# Patient Record
Sex: Male | Born: 1965 | Race: Black or African American | Hispanic: No | Marital: Married | Smoking: Current every day smoker
Health system: Southern US, Community
[De-identification: ages and names within clinical notes are randomized; demographics above are authoritative.]

## PROBLEM LIST (undated history)

## (undated) DIAGNOSIS — I10 Essential (primary) hypertension: Secondary | ICD-10-CM

## (undated) DIAGNOSIS — G4730 Sleep apnea, unspecified: Secondary | ICD-10-CM

---

## 2002-07-01 ENCOUNTER — Emergency Department (HOSPITAL_COMMUNITY): Admission: EM | Admit: 2002-07-01 | Discharge: 2002-07-02 | Payer: Self-pay

## 2006-03-12 ENCOUNTER — Ambulatory Visit: Payer: Self-pay | Admitting: Infectious Diseases

## 2007-01-12 ENCOUNTER — Emergency Department (HOSPITAL_COMMUNITY): Admission: EM | Admit: 2007-01-12 | Discharge: 2007-01-12 | Payer: Self-pay | Admitting: *Deleted

## 2007-02-13 ENCOUNTER — Inpatient Hospital Stay (HOSPITAL_COMMUNITY): Admission: EM | Admit: 2007-02-13 | Discharge: 2007-02-16 | Payer: Self-pay | Admitting: *Deleted

## 2007-02-13 ENCOUNTER — Emergency Department (HOSPITAL_COMMUNITY): Admission: EM | Admit: 2007-02-13 | Discharge: 2007-02-13 | Payer: Self-pay | Admitting: Family Medicine

## 2007-02-13 ENCOUNTER — Telehealth: Payer: Self-pay | Admitting: Infectious Diseases

## 2007-02-13 DIAGNOSIS — R519 Headache, unspecified: Secondary | ICD-10-CM | POA: Insufficient documentation

## 2007-02-13 DIAGNOSIS — R51 Headache: Secondary | ICD-10-CM

## 2007-02-13 DIAGNOSIS — L0293 Carbuncle, unspecified: Secondary | ICD-10-CM

## 2007-02-13 DIAGNOSIS — I10 Essential (primary) hypertension: Secondary | ICD-10-CM

## 2007-02-13 DIAGNOSIS — L0292 Furuncle, unspecified: Secondary | ICD-10-CM | POA: Insufficient documentation

## 2008-01-11 ENCOUNTER — Emergency Department (HOSPITAL_COMMUNITY): Admission: EM | Admit: 2008-01-11 | Discharge: 2008-01-12 | Payer: Self-pay | Admitting: Emergency Medicine

## 2008-10-21 ENCOUNTER — Emergency Department (HOSPITAL_COMMUNITY): Admission: EM | Admit: 2008-10-21 | Discharge: 2008-10-21 | Payer: Self-pay | Admitting: Emergency Medicine

## 2011-02-04 LAB — URINE MICROSCOPIC-ADD ON

## 2011-02-04 LAB — URINALYSIS, ROUTINE W REFLEX MICROSCOPIC
Glucose, UA: NEGATIVE mg/dL
Hgb urine dipstick: NEGATIVE
Nitrite: NEGATIVE
Protein, ur: 30 mg/dL — AB
Specific Gravity, Urine: 1.036 — ABNORMAL HIGH (ref 1.005–1.030)
Urobilinogen, UA: 0.2 mg/dL (ref 0.0–1.0)
pH: 5.5 (ref 5.0–8.0)

## 2011-02-04 LAB — COMPREHENSIVE METABOLIC PANEL WITH GFR
ALT: 22 U/L (ref 0–53)
BUN: 10 mg/dL (ref 6–23)
CO2: 25 meq/L (ref 19–32)
Calcium: 9.3 mg/dL (ref 8.4–10.5)
Creatinine, Ser: 1 mg/dL (ref 0.4–1.5)
GFR calc non Af Amer: >60 mL/min (ref >60)
Glucose, Bld: 104 mg/dL — ABNORMAL HIGH (ref 70–99)
Sodium: 136 meq/L (ref 135–145)

## 2011-02-04 LAB — CBC
HCT: 45.8 % (ref 39.0–52.0)
Hemoglobin: 15.5 g/dL (ref 13.0–17.0)
MCHC: 33.7 g/dL (ref 30.0–36.0)
MCV: 93.1 fL (ref 78.0–100.0)
Platelets: 213 10*3/uL (ref 150–400)
RBC: 4.92 MIL/uL (ref 4.22–5.81)
RDW: 12.8 % (ref 11.5–15.5)
WBC: 11.8 10*3/uL — ABNORMAL HIGH (ref 4.0–10.5)

## 2011-02-04 LAB — COMPREHENSIVE METABOLIC PANEL
AST: 26 U/L (ref 0–37)
Albumin: 4.3 g/dL (ref 3.5–5.2)
Alkaline Phosphatase: 63 U/L (ref 39–117)
Chloride: 99 mEq/L (ref 96–112)
GFR calc Af Amer: >60 mL/min (ref >60)
Potassium: 3.8 mEq/L (ref 3.5–5.1)
Total Bilirubin: 1.2 mg/dL (ref 0.3–1.2)
Total Protein: 7.8 g/dL (ref 6.0–8.3)

## 2011-02-04 LAB — DIFFERENTIAL
Basophils Absolute: 0.2 10*3/uL — ABNORMAL HIGH (ref 0.0–0.1)
Basophils Relative: 1 % (ref 0–1)
Eosinophils Absolute: 0 K/uL (ref 0.0–0.7)
Eosinophils Relative: 0 % (ref 0–5)
Lymphocytes Relative: 13 % (ref 12–46)
Lymphs Abs: 1.5 K/uL (ref 0.7–4.0)
Monocytes Absolute: 0.9 10*3/uL (ref 0.1–1.0)
Monocytes Relative: 8 % (ref 3–12)
Neutro Abs: 9.2 K/uL — ABNORMAL HIGH (ref 1.7–7.7)
Neutrophils Relative %: 78 % — ABNORMAL HIGH (ref 43–77)

## 2011-02-04 LAB — LIPASE, BLOOD: Lipase: 68 U/L — ABNORMAL HIGH (ref 11–59)

## 2011-03-08 NOTE — Consult Note (Signed)
NAME:  [name] [name], [name] [name]              ACCOUNT NO.:  [id number]   MEDICAL RECORD NO.:  [id number]          PATIENT TYPE:  INP   LOCATION:  5006                         FACILITY:  [facility]   PHYSICIAN:  Mark C. Ophelia Charter, M.D.    DATE OF BIRTH:  02-Sep-1966   DATE OF CONSULTATION:  DATE OF DISCHARGE:                                 CONSULTATION   ORTHOPEDIC CONSULTATION   REQUESTING PHYSICIAN:  Lonia Blood, M.D.,  and [facility]   REASON FOR CONSULTATION:  Right draining elbow wound infection.   This is a 45 year old male who has had a five to seven-day history of  right elbow pain that originally started as a pimple and then became a  boil and then began draining.  He was seen in urgent care and placed on  some antibiotics by his local doctor initially, unknown type, and then  increasing problems and seen in urgent care yesterday.  Initially, white  count was 9800 with a left shift, then he was later referred to the main  emergency room for admission.  There has been breakdown in the right  olecranon bursal skin with a 1 x 2-cm area with a small amount of  necrotic tissue present.  Gram-stain shows gram-positive cocci.  He  states he has had a history of MRSA now that it is mentioned to him.   SOCIAL HISTORY:  Positive for tobacco, ETOH, is not working.   ASSESSMENT:  Likely methicillin-resistant Staphylococcus aureus right  elbow.  X-rays were negative.   Likely methicillin-resistant Staphylococcus aureus right olecranon  bursa.  The hand is swollen, puffy, no evidence of remote abscess.  He  needs to have his hand elevated, needs vancomycin to work on finger  range of motion, soak his elbow daily in some warm soapy water with  liquid Dial soap and wet-to-dry dressing changes, and arm elevator  sling.  After a couple of days of vancomycin, he should be able to be  switched to p.o. medication and discharged pending culture results.  This has a typical appearance of a MRSA infection,  and since it is  draining and he is on appropriate antibiotics, this should improve and  resolve.   Thank you for the opportunity to see the patient in consultation.      Mark C. Ophelia Charter, M.D.  Electronically Signed     MCY/MEDQ  D:  02/14/2007  T:  02/14/2007  Job:  [id number]   cc:   Lonia Blood, M.D.  [facility]

## 2011-03-08 NOTE — H&P (Signed)
NAME:  [name], [name]              ACCOUNT NO.:  [id number]   MEDICAL RECORD NO.:  [id number]          PATIENT TYPE:  INP   LOCATION:  5006                         FACILITY:  [facility]   PHYSICIAN:  Lonia Blood, M.D.       DATE OF BIRTH:  24-Oct-1965   DATE OF ADMISSION:  02/13/2007  DATE OF DISCHARGE:                              HISTORY & PHYSICAL   PRIMARY CARE PHYSICIAN:  Dr. Leonette Most which makes the patient unassigned  for [facility].   CHIEF COMPLAINTS:  Right elbow pain.   HISTORY OF PRESENT ILLNESS:  [name] [name] is a 45 year old gentleman with  a history of hypertension and migraine headaches, who a week prior to  admission noted that he was having some swelling over his right elbow.  Three days prior to admission, the patient went to see his primary care  physician who prescribed an oral antibiotic.  The patient continued to  have swelling of his right upper extremity and he noticed that he was  having a copious amount of pus draining from his right elbow.  Because  his pain was getting worse, the patient presented today to the emergency  room at [facility].  The patient denies any fever, chills or  sweats.  The patient denies any nausea, vomiting or diarrhea.   PAST MEDICAL HISTORY:  Cluster headaches, hypertension, MRSA and  migraine headaches.   MEDICATIONS:  1. Norvasc 5 mg daily.  2. Verapamil 120 mg daily.  3. Prednisone as needed.  4. Protonix 40 mg daily.  5. Lidocaine patches as needed.   SOCIAL HISTORY:  The patient has a girlfriend that he has been living  with for the past 6 months. He used to have a [employer] but  he lost the finding so he is now jobless.  This the patient drinks hard  liquor and beer every day.  He smokes a pack of cigarettes every day.  He does not have any children.   FAMILY HISTORY:  Noncontributory.   REVIEW OF SYSTEMS:  As per HPI.  Other systems are negative.   PHYSICAL EXAMINATION ON ADMISSION:   VITAL SIGNS:  Temperature 97.2,  blood pressure 130/80, pulse 107, respirations 18, saturation 99% on  room air.  GENERAL APPEARANCE:  A well-developed, well-nourished, African-American  gentleman sitting on the stretcher in no acute distress. Alert and  oriented to place, person and time.  HEENT:  Head normocephalic, atraumatic. Eyes, pupils equal round and  reactive to light and accommodation.  Extraocular movements intact.  There is some small palpebral edema. Throat is clear.  NECK:  Supple.  No JVD.  No carotid bruits.  CHEST:  Clear to auscultation bilaterally without wheezes, rhonchi or  crackles.  HEART:  Regular rate and rhythm without murmurs, rubs or gallops.  ABDOMEN:  Soft, nontender, nondistended. Bowel sounds are present.  EXTREMITIES:  Lower Extremities have no edema.  MUSCULOSKELETAL: The right upper extremity has edema extending from  throughout the hand and forearm up to half of the arm. There is also  erythema but no appreciable tenderness.  Right over  the olecranon bursa,  there is an area of about 2 cm of draining deep pus that seems like it  has been packed earlier today with gauze. The plus is yellowish-brownish  in color.  There is no foul smell associated with it.  The patient has a  full range of motion in the right elbow.   LABORATORY VALUES:  On admission white blood cell count 9.80, hemoglobin  is 13.6 and platelet count is 250.   ASSESSMENT/PLAN:  1. Right upper extremity right olecranon bursitis with right upper      extremity cellulitis.  The patient will be admitted to acute care      unit at [facility].  We will obtain blood cultures and      wound culture.  For now we will place the patient empirically on      vancomycin to cover for methicillin resistance Staphylococcus      aureus and on Rocephin to cover for usual gram-negative rods.  At      this point in time, I doubt this patient has an anaerobic abscess.      As soon as we have the  Gram's stain available, we can narrow down      the antibiotics.  A consultation with Dr. Ophelia Charter from orthopedics      has been obtained.  2. Migraine headaches and hypertension. The patient will be continued      on his verapamil and he will be treated as needed for his migraine.      Lonia Blood, M.D.  Electronically Signed     SL/MEDQ  D:  02/13/2007  T:  02/14/2007  Job:  [id number]

## 2011-03-08 NOTE — Discharge Summary (Signed)
NAME:  [name] [name], [name] [name]              ACCOUNT NO.:  [id number]   MEDICAL RECORD NO.:  [id number]          PATIENT TYPE:  INP   LOCATION:  5006                         FACILITY:  [facility]   PHYSICIAN:  Wilson Singer, M.D.DATE OF BIRTH:  06/20/1966   DATE OF ADMISSION:  02/13/2007  DATE OF DISCHARGE:  02/16/2007                               DISCHARGE SUMMARY   FINAL DISCHARGE DIAGNOSES:  1. Methicillin-resistant Staphylococcus aureus cellulitis of the right      arm and olecranon bursitis.  2. Hypertension.   DISCHARGE MEDICATIONS:  1. Verapamil 120 mg daily.  2. Bactrim DS 1 tablet b.i.d. for 2 weeks.  3. Doxycycline 100 mg b.i.d. for 2 weeks.   CONDITION ON DISCHARGE:  Stable.   HISTORY:  This 45 year old man presented with swelling over the right  elbow and right arm.  This progressed into further swelling over the  whole of the right arm almost, except the upper arm.  Please see initial  history and physical examination by Dr. Lonia Blood.   HOSPITAL PROGRESS:  The patient was admitted and started empirically on  intravenous vancomycin and Rocephin to cover gram-positive and gram-  negative.  The right elbow area, the pus has been draining and cultures  came back as MRSA.  Therefore, the antibiotic was narrowed down to  intravenous vancomycin.  On the day of discharge, the right arm was  looking extremely good with much reduced swelling, tenderness, and  redness.  Dr. Ophelia Charter, orthopedic surgeon, consulted on this patient and  agreed that the patient would be able to tolerate oral antibiotics now,  but he needed a course for 2 weeks.  On the day of discharge, he looks  well.  The right arm is much less swollen.  Temperature 98.6, blood  pressure 143/100, pulse 100, saturation 97% on room air.  The right arm  is looking much better.  Blood cultures have been negative so far and as  mentioned above, the wound culture is showing MRSA sensitive to  tetracycline and Bactrim.   FURTHER DISPOSITION:  He is being sent home with 2 weeks of antibiotics  of Bactrim DS and doxycycline.  Dr. Ophelia Charter will see him later on this  week for followup.  He will need dressing changes also and I have asked  him to contact Dr. Ophelia Charter' office to arrange this.      Wilson Singer, M.D.  Electronically Signed     NCG/MEDQ  D:  02/16/2007  T:  02/16/2007  Job:  [id number]   cc:   Jonah Blue, M.D.

## 2011-07-15 LAB — POCT I-STAT, CHEM 8
BUN: 7
HCT: 49
Sodium: 133 — ABNORMAL LOW
TCO2: 30

## 2011-07-15 LAB — HEPATIC FUNCTION PANEL
Albumin: 4
Alkaline Phosphatase: 84
Total Protein: 7.8

## 2011-07-15 LAB — DIFFERENTIAL
Basophils Absolute: 0.4 — ABNORMAL HIGH
Basophils Relative: 2 — ABNORMAL HIGH
Eosinophils Relative: 0
Lymphocytes Relative: 6 — ABNORMAL LOW
Neutro Abs: 13.8 — ABNORMAL HIGH

## 2011-07-15 LAB — LIPASE, BLOOD: Lipase: 274 — ABNORMAL HIGH

## 2011-07-15 LAB — CBC
Platelets: 237
RDW: 13.2

## 2011-07-15 LAB — URINALYSIS, ROUTINE W REFLEX MICROSCOPIC
Leukocytes, UA: NEGATIVE
Nitrite: NEGATIVE
Specific Gravity, Urine: 1.022
pH: 8.5 — ABNORMAL HIGH

## 2011-07-15 LAB — URINE MICROSCOPIC-ADD ON

## 2012-02-17 ENCOUNTER — Emergency Department (HOSPITAL_COMMUNITY)
Admission: EM | Admit: 2012-02-17 | Discharge: 2012-02-17 | Disposition: A | Discharge status: Home / Self Care | Payer: Self-pay | Attending: Emergency Medicine | Admitting: Emergency Medicine

## 2012-02-17 ENCOUNTER — Encounter (HOSPITAL_COMMUNITY): Payer: Self-pay | Admitting: Emergency Medicine

## 2012-02-17 DIAGNOSIS — M545 Low back pain, unspecified: Secondary | ICD-10-CM | POA: Insufficient documentation

## 2012-02-17 DIAGNOSIS — M79609 Pain in unspecified limb: Secondary | ICD-10-CM | POA: Insufficient documentation

## 2012-02-17 HISTORY — DX: Sleep apnea, unspecified: G47.30

## 2012-02-17 HISTORY — DX: Essential (primary) hypertension: I10

## 2012-02-17 NOTE — ED Notes (Addendum)
Pt ao x 4.  States pain increases with walking from triage room.  No sob or chest pain. Is going outside to smoke a cigarette.

## 2012-02-17 NOTE — ED Notes (Signed)
PT has pain in right leg and reports pain in thigh and lower back that is not swollen. PT reports hurts when he is active on it. No injury or bruising.

## 2012-02-17 NOTE — ED Notes (Signed)
No answer in the WR at this time.

## 2012-02-19 ENCOUNTER — Emergency Department (HOSPITAL_COMMUNITY): Payer: Self-pay

## 2012-02-19 ENCOUNTER — Emergency Department (HOSPITAL_COMMUNITY)
Admission: EM | Admit: 2012-02-19 | Discharge: 2012-02-19 | Disposition: A | Discharge status: Home / Self Care | Payer: Self-pay | Attending: Emergency Medicine | Admitting: Emergency Medicine

## 2012-02-19 ENCOUNTER — Encounter (HOSPITAL_COMMUNITY): Payer: Self-pay | Admitting: *Deleted

## 2012-02-19 DIAGNOSIS — M79609 Pain in unspecified limb: Secondary | ICD-10-CM

## 2012-02-19 DIAGNOSIS — M79651 Pain in right thigh: Secondary | ICD-10-CM

## 2012-02-19 DIAGNOSIS — I10 Essential (primary) hypertension: Secondary | ICD-10-CM | POA: Insufficient documentation

## 2012-02-19 DIAGNOSIS — M545 Low back pain, unspecified: Secondary | ICD-10-CM | POA: Insufficient documentation

## 2012-02-19 MED ORDER — ORPHENADRINE CITRATE ER 100 MG PO TB12: 100.0000 mg | ORAL_TABLET | Freq: Two times a day (BID) | ORAL | Status: AC

## 2012-02-19 MED ORDER — OXYCODONE-ACETAMINOPHEN 5-325 MG PO TABS
1.0000 | ORAL_TABLET | Freq: Once | ORAL | Status: AC
  Administered 2012-02-19: 1 via ORAL
  Filled 2012-02-19: qty 1

## 2012-02-19 NOTE — Progress Notes (Signed)
VASCULAR LAB PRELIMINARY  PRELIMINARY  PRELIMINARY  PRELIMINARY  Right lower extremity venous duplex completed.    Preliminary report: Right:  No evidence of DVT, superficial thrombosis, or Baker's cyst.  [name] [name] D, 02/19/2012, 10:35 AM

## 2012-02-19 NOTE — Discharge Instructions (Signed)
Read the information below.  Continue to take your antiinflammatory medications at home as well as the prescribed muscle relaxant.  Call your primary care provider today to schedule a close follow up appointment.  If you develop fevers, swelling or redness in your thigh, weakness or numbness of your leg, or you have difficulty walking, please return to the ER immediately for a follow up appointment.  You may return to the ER at any time for worsening condition or any new symptoms that concern you.   Pain of Unknown Etiology (Pain Without a Known Cause) You have come to your caregiver because of pain. Pain can occur in any part of the body. Often there is not a definite cause. If your laboratory (blood or urine) work was normal and x-rays or other studies were normal, your caregiver may treat you without knowing the cause of the pain. An example of this is the headache. Most headaches are diagnosed by taking a history. This means your caregiver asks you questions about your headaches. Your caregiver determines a treatment based on your answers. Usually testing done for headaches is normal. Often testing is not done unless there is no response to medications. Regardless of where your pain is located today, you can be given medications to make you comfortable. If no physical cause of pain can be found, most cases of pain will gradually leave as suddenly as they came.  If you have a painful condition and no reason can be found for the pain, It is importantthat you follow up with your caregiver. If the pain becomes worse or does not go away, it may be necessary to repeat tests and look further for a possible cause.  Only take over-the-counter or prescription medicines for pain, discomfort, or fever as directed by your caregiver.   For the protection of your privacy, test results can not be given over the phone. Make sure you receive the results of your test. Ask as to how these results are to be obtained if you  have not been informed. It is your responsibility to obtain your test results.   You may continue all activities unless the activities cause more pain. When the pain lessens, it is important to gradually resume normal activities. Resume activities by beginning slowly and gradually increasing the intensity and duration of the activities or exercise. During periods of severe pain, bed-rest may be helpful. Lay or sit in any position that is comfortable.   Ice used for acute (sudden) conditions may be effective. Use a large plastic bag filled with ice and wrapped in a towel. This may provide pain relief.   See your caregiver for continued problems. They can help or refer you for exercises or physical therapy if necessary.  If you were given medications for your condition, do not drive, operate machinery or power tools, or sign legal documents for 24 hours. Do not drink alcohol, take sleeping pills, or take other medications that may interfere with treatment. See your caregiver immediately if you have pain that is becoming worse and not relieved by medications. Document Released: 07/02/2001 Document Revised: 09/26/2011 Document Reviewed: 10/07/2005 [facility] Patient Information 2012 [place], [place].

## 2012-02-19 NOTE — ED Notes (Signed)
NAD noted at time of d/c home. D/C inst reviewed by Chad, PA-C, pt verbalized understanding.

## 2012-02-19 NOTE — ED Provider Notes (Signed)
Medical screening examination/treatment/procedure(s) were performed by non-physician practitioner and as supervising physician I was immediately available for consultation/collaboration.   Gwyneth Sprout, MD 02/19/12 2043

## 2012-02-19 NOTE — ED Provider Notes (Signed)
History     CSN: [id number]  Arrival date & time 02/19/12  4098   First MD Initiated Contact with Patient 02/19/12 [phone]      Chief Complaint  Patient presents with  . Leg Pain    (Consider location/radiation/quality/duration/timing/severity/associated sxs/prior treatment) HPI Comments: Patient reports right thigh pain that began overnight approximately 2 months ago.  The pain is aching and throbbing, constant, exacerbated by walking and palpation.  He has used heating pads and aleve without improvement.  Pt states his brother works in [employer] and told him to come to the ER to make sure he didn't have a blood clot.  Denies fevers, injury, hip pain, CP, SOB, weakness or numbness of the extremities.  Does admit to aching low back pain that he has at the same time as the thigh pain.  No personal or family hx of blood clots, no recent immobilization.  Pt does smoke.    Patient is a 46 y.o. male presenting with leg pain. The history is provided by the patient.  Leg Pain  Pertinent negatives include no numbness.    Past Medical History  Diagnosis Date  . Sleep apnea   . Hypertension     History reviewed. No pertinent past surgical history.  No family history on file.  History  Substance Use Topics  . Smoking status: Not on file  . Smokeless tobacco: Not on file  . Alcohol Use: 2.4 oz/week    4 Shots of liquor per week      Review of Systems  Constitutional: Negative for fever.  Respiratory: Negative for chest tightness and shortness of breath.   Cardiovascular: Negative for chest pain.  Gastrointestinal: Negative for abdominal pain.  Musculoskeletal: Positive for back pain. Negative for gait problem.  Neurological: Negative for weakness and numbness.  All other systems reviewed and are negative.    Allergies  Review of patient's allergies indicates no known allergies.  Home Medications   Current Outpatient Rx  Name Route Sig Dispense Refill  .  AMLODIPINE BESYLATE 5 MG PO TABS Oral Take 5 mg by mouth daily.    Marland Kitchen NAPROXEN SODIUM 220 MG PO TABS Oral Take 220 mg by mouth 2 (two) times daily with a meal.    . OXYCODONE-ACETAMINOPHEN 5-325 MG PO TABS Oral Take 1 tablet by mouth every 4 (four) hours as needed. For pain      BP 155/102  Pulse 103  Temp(Src) 98.3 F (36.8 C) (Oral)  Resp 20  SpO2 100%  Physical Exam  Nursing note and vitals reviewed. Constitutional: He is oriented to person, place, and time. He appears well-developed and well-nourished.  HENT:  Head: Normocephalic and atraumatic.  Neck: Neck supple.  Cardiovascular: Normal rate and regular rhythm.   Pulmonary/Chest: Effort normal and breath sounds normal. No respiratory distress. He has no wheezes. He has no rales.  Abdominal: Soft. There is no tenderness.  Musculoskeletal: He exhibits tenderness. He exhibits no edema.       Right hip: He exhibits normal range of motion, normal strength, no tenderness, no bony tenderness, no swelling, no crepitus and no deformity.       Right knee: He exhibits normal range of motion, no swelling, no effusion and no deformity. no tenderness found.       Right upper leg: He exhibits tenderness. He exhibits no swelling, no edema, no deformity and no laceration.       Right lower leg: Normal.       Left lower  leg: He exhibits no tenderness and no swelling.       Legs:      Lower extremities: strength 5/5, sensation intact, distal pulses intact.  No edema, erythema, warmth.     Neurological: He is alert and oriented to person, place, and time. He exhibits normal muscle tone.  Psychiatric: He has a normal mood and affect. His behavior is normal. Thought content normal.    ED Course  Procedures (including critical care time)  Labs Reviewed - No data to display Dg Lumbar Spine Complete  02/19/2012  *RADIOLOGY REPORT*  Clinical Data: Right lower back pain into right femur, no injury  LUMBAR SPINE - COMPLETE 4+ VIEW  Comparison: None   Findings: Osseous mineralization normal. Five non-rib bearing lumbar vertebrae. Minimal scattered endplate spur formation. Slight disc space narrowing L4-L5. Vertebral body and disc space heights otherwise maintained. No acute fracture, subluxation, or bone destruction. No spondylolysis. SI joints symmetric.  IMPRESSION: Minimal degenerative disc disease changes. No acute abnormalities.  Original Report Authenticated By: Lollie Marrow, M.D.   Dg Femur Right  02/19/2012  *RADIOLOGY REPORT*  Clinical Data: Pain in right low back to distal right femur, no known injury  RIGHT FEMUR - 2 VIEW  Comparison: None  Findings: Bone mineralization normal. Joint spaces preserved. No fracture, dislocation, or bone destruction. Ununited ossification center at tibial tubercle.  IMPRESSION: No acute right femoral abnormalities.  Original Report Authenticated By: Lollie Marrow, M.D.    Patient reports that he did drive to the ER but prefers to have pain medication here and call for a ride.  Have given PO percocet.   1. Right thigh pain       MDM  Patient with two months of pain in his anterior and posterior thigh without injury.  No associated or systemic symptoms with exception of mild low back pain.  As patient describes it, the pain does not appear to be radicular.  It is reproducible with palpation.  Doppler US is negative for DVT, xrays negative.  Likely muscular pain.  Pt d/c home with muscle relaxant, PCP follow up.  Return precautions given.  Patient verbalizes understanding and agrees with plan.          Rise Patience, [place] 02/19/12 1109

## 2012-02-19 NOTE — ED Notes (Addendum)
Patient returned from radiology resting comfortably on stretcher reading a book.

## 2012-02-19 NOTE — ED Notes (Signed)
Pt is here for right hip and leg pain, mostly in his right thigh.  Pt states that the pain has been going on for 2 months.  Pt denies swelling and states that the pain increases with certain activities such as mowing a lawn.

## 2012-02-19 NOTE — ED Notes (Signed)
Pt to doppler

## 2015-09-07 ENCOUNTER — Ambulatory Visit: Payer: Self-pay | Admitting: Family

## 2015-09-08 ENCOUNTER — Encounter: Payer: Self-pay | Admitting: Family

## 2015-09-08 ENCOUNTER — Ambulatory Visit (INDEPENDENT_AMBULATORY_CARE_PROVIDER_SITE_OTHER): Payer: PRIVATE HEALTH INSURANCE | Admitting: Family

## 2015-09-08 VITALS — BP 150/100 | HR 100 | Temp 98.4°F | Resp 18 | Ht 73.0 in | Wt 187.0 lb

## 2015-09-08 DIAGNOSIS — I10 Essential (primary) hypertension: Secondary | ICD-10-CM

## 2015-09-08 MED ORDER — AMLODIPINE BESYLATE 5 MG PO TABS: 5.0000 mg | ORAL_TABLET | Freq: Every day | ORAL | Status: DC

## 2015-09-08 NOTE — Patient Instructions (Addendum)
Thank you for choosing [facility].  Summary/Instructions:  Please schedule a time for your physical at your convenience.   Your prescription(s) have been submitted to your pharmacy or been printed and provided for you. Please take as directed and contact our office if you believe you are having problem(s) with the medication(s) or have any questions.   If your symptoms worsen or fail to improve, please contact our office for further instruction, or in case of emergency go directly to the emergency room at the closest medical facility.    DASH Eating Plan DASH stands for "Dietary Approaches to Stop Hypertension." The DASH eating plan is a healthy eating plan that has been shown to reduce high blood pressure (hypertension). Additional health benefits may include reducing the risk of type 2 diabetes mellitus, heart disease, and stroke. The DASH eating plan may also help with weight loss. WHAT DO I NEED TO KNOW ABOUT THE DASH EATING PLAN? For the DASH eating plan, you will follow these general guidelines:  Choose foods with a percent daily value for sodium of less than 5% (as listed on the food label).  Use salt-free seasonings or herbs instead of table salt or sea salt.  Check with your health care provider or pharmacist before using salt substitutes.  Eat lower-sodium products, often labeled as "lower sodium" or "no salt added."  Eat fresh foods.  Eat more vegetables, fruits, and low-fat dairy products.  Choose whole grains. Look for the word "whole" as the first word in the ingredient list.  Choose fish and skinless chicken or [place] more often than red meat. Limit fish, poultry, and meat to 6 oz (170 g) each day.  Limit sweets, desserts, sugars, and sugary drinks.  Choose heart-healthy fats.  Limit cheese to 1 oz (28 g) per day.  Eat more home-cooked food and less restaurant, buffet, and fast food.  Limit fried foods.  Cook foods using methods other than  frying.  Limit canned vegetables. If you do use them, rinse them well to decrease the sodium.  When eating at a restaurant, ask that your food be prepared with less salt, or no salt if possible. WHAT FOODS CAN I EAT? Seek help from a dietitian for individual calorie needs. Grains Whole grain or whole wheat bread. Brown rice. Whole grain or whole wheat pasta. Quinoa, bulgur, and whole grain cereals. Low-sodium cereals. Corn or whole wheat flour tortillas. Whole grain cornbread. Whole grain crackers. Low-sodium crackers. Vegetables Fresh or frozen vegetables (raw, steamed, roasted, or grilled). Low-sodium or reduced-sodium tomato and vegetable juices. Low-sodium or reduced-sodium tomato sauce and paste. Low-sodium or reduced-sodium canned vegetables.  Fruits All fresh, canned (in natural juice), or frozen fruits. Meat and Other Protein Products Ground beef (85% or leaner), grass-fed beef, or beef trimmed of fat. Skinless chicken or [place]. Ground chicken or [place]key. Pork trimmed of fat. All fish and seafood. Eggs. Dried beans, peas, or lentils. Unsalted nuts and seeds. Unsalted canned beans. Dairy Low-fat dairy products, such as skim or 1% milk, 2% or reduced-fat cheeses, low-fat ricotta or cottage cheese, or plain low-fat yogurt. Low-sodium or reduced-sodium cheeses. Fats and Oils Tub margarines without trans fats. Light or reduced-fat mayonnaise and salad dressings (reduced sodium). Avocado. Safflower, olive, or canola oils. Natural peanut or almond butter. Other Unsalted popcorn and pretzels. The items listed above may not be a complete list of recommended foods or beverages. Contact your dietitian for more options. WHAT FOODS ARE NOT RECOMMENDED? Grains White bread. White pasta. White rice.  Refined cornbread. Bagels and croissants. Crackers that contain trans fat. Vegetables Creamed or fried vegetables. Vegetables in a cheese sauce. Regular canned vegetables. Regular canned tomato sauce  and paste. Regular tomato and vegetable juices. Fruits Dried fruits. Canned fruit in light or heavy syrup. Fruit juice. Meat and Other Protein Products Fatty cuts of meat. Ribs, chicken wings, bacon, sausage, bologna, salami, chitterlings, fatback, hot dogs, bratwurst, and packaged luncheon meats. Salted nuts and seeds. Canned beans with salt. Dairy Whole or 2% milk, cream, half-and-half, and cream cheese. Whole-fat or sweetened yogurt. Full-fat cheeses or blue cheese. Nondairy creamers and whipped toppings. Processed cheese, cheese spreads, or cheese curds. Condiments Onion and garlic salt, seasoned salt, table salt, and sea salt. Canned and packaged gravies. Worcestershire sauce. Tartar sauce. Barbecue sauce. Teriyaki sauce. Soy sauce, including reduced sodium. Steak sauce. Fish sauce. Oyster sauce. Cocktail sauce. Horseradish. Ketchup and mustard. Meat flavorings and tenderizers. Bouillon cubes. Hot sauce. Tabasco sauce. Marinades. Taco seasonings. Relishes. Fats and Oils Butter, stick margarine, lard, shortening, ghee, and bacon fat. Coconut, palm kernel, or palm oils. Regular salad dressings. Other Pickles and olives. Salted popcorn and pretzels. The items listed above may not be a complete list of foods and beverages to avoid. Contact your dietitian for more information. WHERE CAN I FIND MORE INFORMATION? National Heart, Lung, and Blood Institute: [employer]   This information is not intended to replace advice given to you by your health care provider. Make sure you discuss any questions you have with your health care provider.   Document Released: 09/26/2011 Document Revised: 10/28/2014 Document Reviewed: 08/11/2013 Elsevier Interactive Patient Education [employer]

## 2015-09-08 NOTE — Progress Notes (Signed)
Pre visit review using our clinic review tool, if applicable. No additional management support is needed unless otherwise documented below in the visit note.   refused flu shot

## 2015-09-08 NOTE — Progress Notes (Signed)
Subjective:    Patient ID: [name] [name], male    DOB: 03-31-1966, 49 y.o.   MRN: [id number]  Chief Complaint  Patient presents with  . Establish Care    wants to get back on BP medication    HPI:  [name] [name] is a 49 y.o. male who  has a past medical history of Sleep apnea and Hypertension. and presents today for an office visit to establish care.   1.) Hypertension - Previously diagnosed with hypertension and prescribed amlodipine. Reports that he has not taken the medication in about 1 month secondary to availability and changing providers. Denies adverse side effects or hypotensive events. Due for an eye exam.   BP Readings from Last 3 Encounters:  09/08/15 150/100  02/19/12 151/100  02/17/12 141/79    No Known Allergies   Outpatient Prescriptions Prior to Visit  Medication Sig Dispense Refill  . amLODipine (NORVASC) 5 MG tablet Take 5 mg by mouth daily.    . naproxen sodium (ANAPROX) 220 MG tablet Take 220 mg by mouth 2 (two) times daily with a meal.    . oxyCODONE-acetaminophen (PERCOCET) 5-325 MG per tablet Take 1 tablet by mouth every 4 (four) hours as needed. For pain     No facility-administered medications prior to visit.     Past Medical History  Diagnosis Date  . Sleep apnea   . Hypertension      History reviewed. No pertinent past surgical history.   Family History  Problem Relation Age of Onset  . Stroke Mother   . Hypertension Mother   . Lung cancer Father      Social History   Social History  . Marital Status: Married    Spouse Name: N/A  . Number of Children: 1  . Years of Education: 14   Occupational History  . Production    Social History Main Topics  . Smoking status: Current Every Day Smoker -- 0.50 packs/day for 20 years  . Smokeless tobacco: Never Used  . Alcohol Use: 2.4 oz/week    4 Shots of liquor per week  . Drug Use: No  . Sexual Activity: Not on file   Other Topics Concern  . Not on file   Social History  Narrative   Fun: Be off of work and be with family.       Review of Systems  Eyes:       Negative for changes in vision.   Respiratory: Negative for chest tightness and shortness of breath.   Cardiovascular: Negative for chest pain, palpitations and leg swelling.  Neurological: Negative for headaches.      Objective:    BP 150/100 mmHg  Pulse 100  Temp(Src) 98.4 F (36.9 C) (Oral)  Resp 18  Ht  (1.854 m)  Wt 187 lb (84.823 kg)  BMI 24.68 kg/m2  SpO2 97% Nursing note and vital signs reviewed.  Physical Exam  Constitutional: He is oriented to person, place, and time. He appears well-developed and well-nourished. No distress.  Cardiovascular: Normal rate, regular rhythm, normal heart sounds and intact distal pulses.   Pulmonary/Chest: Effort normal and breath sounds normal.  Neurological: He is alert and oriented to person, place, and time.  Skin: Skin is warm and dry.  Psychiatric: He has a normal mood and affect. His behavior is normal. Judgment and thought content normal.       Assessment & Plan:   Problem List Items Addressed This Visit  Cardiovascular and Mediastinum   Essential hypertension - Primary    Hypertension uncontrolled with blood pressure greater than 140/90. Restart amlodipine. Encouraged to monitor blood pressure at home. Due for eye exam which will be scheduled independently. Follow-up in 2 weeks for nurse visit for blood pressure check.      Relevant Medications   amLODipine (NORVASC) 5 MG tablet

## 2015-09-08 NOTE — Assessment & Plan Note (Signed)
Hypertension uncontrolled with blood pressure greater than 140/90. Restart amlodipine. Encouraged to monitor blood pressure at home. Due for eye exam which will be scheduled independently. Follow-up in 2 weeks for nurse visit for blood pressure check.

## 2015-12-03 ENCOUNTER — Other Ambulatory Visit: Payer: Self-pay | Admitting: Family

## 2015-12-25 ENCOUNTER — Other Ambulatory Visit: Payer: Self-pay | Admitting: *Deleted

## 2015-12-25 MED ORDER — AMLODIPINE BESYLATE 5 MG PO TABS: 5.0000 mg | ORAL_TABLET | Freq: Every day | ORAL | Status: DC

## 2015-12-25 NOTE — Telephone Encounter (Signed)
Receive msg needing refill on BP med. Sent amlodipine to [facility].../lmb

## 2016-03-24 ENCOUNTER — Encounter (HOSPITAL_COMMUNITY): Payer: Self-pay | Admitting: *Deleted

## 2016-03-24 ENCOUNTER — Emergency Department (HOSPITAL_COMMUNITY)
Admission: EM | Admit: 2016-03-24 | Discharge: 2016-03-24 | Disposition: A | Discharge status: Home / Self Care | Payer: 59 | Attending: Emergency Medicine | Admitting: Emergency Medicine

## 2016-03-24 DIAGNOSIS — F17210 Nicotine dependence, cigarettes, uncomplicated: Secondary | ICD-10-CM | POA: Diagnosis not present

## 2016-03-24 DIAGNOSIS — Z79899 Other long term (current) drug therapy: Secondary | ICD-10-CM | POA: Insufficient documentation

## 2016-03-24 DIAGNOSIS — H6121 Impacted cerumen, right ear: Secondary | ICD-10-CM | POA: Diagnosis not present

## 2016-03-24 DIAGNOSIS — H9201 Otalgia, right ear: Secondary | ICD-10-CM | POA: Insufficient documentation

## 2016-03-24 DIAGNOSIS — I10 Essential (primary) hypertension: Secondary | ICD-10-CM | POA: Diagnosis not present

## 2016-03-24 NOTE — ED Provider Notes (Signed)
CSN: [id number]     Arrival date & time 03/24/16  1134 History  By signing my name below, I, Soijett Blue, attest that this documentation has been prepared under the direction and in the presence of Bethel BornKelly Marie Helayna Dun, PA-C Electronically Signed: Soijett Blue, ED Scribe. 03/24/2016. 12:10 PM.   Chief Complaint  Patient presents with  . Foreign Body in Ear   The history is provided by the patient. No language interpreter was used.   HPI Comments: [name] ReidMichael A [name] is a 50 y.o. male who presents to the Emergency Department complaining of FB in right ear onset 2 days. Pt reports that while coming into his house, he felt a bug fly into his right ear. Pt notes that following use of the Q-tip, there was minimal blood and he was unsure if it was bug blood or his blood. Pt has tried Q-tip and mineral oil for the relief of his symptoms. Pt came into the ED today for evaluation of his right ear and to determine if there is still a bug in his ear. Denies drainage, HA, cold symptoms, ear pain, and any other symptoms.   Past Medical History  Diagnosis Date  . Sleep apnea   . Hypertension    History reviewed. No pertinent past surgical history. Family History  Problem Relation Age of Onset  . Stroke Mother   . Hypertension Mother   . Lung cancer Father    Social History  Substance Use Topics  . Smoking status: Current Every Day Smoker -- 1.00 packs/day for 20 years    Types: Cigarettes  . Smokeless tobacco: Never Used  . Alcohol Use: 2.4 oz/week    4 Shots of liquor per week     Comment: week-ends    Review of Systems  Constitutional: Negative for fever and chills.  HENT: Negative for ear discharge and ear pain.        Foreign body sensation to right ear    Allergies  Review of patient's allergies indicates no known allergies.  Home Medications   Prior to Admission medications   Medication Sig Start Date End Date Taking? Authorizing Provider  amLODipine (NORVASC) 5 MG tablet Take 1  tablet (5 mg total) by mouth daily. 12/25/15  Yes Veryl SpeakGregory D Calone, FNP   BP 130/76 mmHg  Pulse 96  Temp(Src) 98.2 F (36.8 C) (Oral)  Resp 14  SpO2 99% Physical Exam  Constitutional: He is oriented to person, place, and time. He appears well-developed and well-nourished. No distress.  HENT:  Head: Normocephalic and atraumatic.  Right Ear: Tympanic membrane and ear canal normal.  Left Ear: Tympanic membrane and ear canal normal.  No FB noted to right canal. No drainage, blood, cuts, or abrasions. No perforation. TM clear. Minimal cerumen to right canal.  Eyes: Conjunctivae are normal. Pupils are equal, round, and reactive to light. Right eye exhibits no discharge. Left eye exhibits no discharge. No scleral icterus.  Neck: Normal range of motion.  Cardiovascular: Normal rate.   Pulmonary/Chest: Effort normal. No respiratory distress.  Abdominal: Soft. He exhibits no distension.  Neurological: He is alert and oriented to person, place, and time.  Skin: Skin is warm and dry.  Psychiatric: He has a normal mood and affect.  Nursing note and vitals reviewed.   ED Course  Procedures (including critical care time) DIAGNOSTIC STUDIES: Oxygen Saturation is 99% on RA, nl by my interpretation.    COORDINATION OF CARE: 12:09 PM Discussed treatment plan with pt at bedside and  pt agreed to plan.    Labs Review Labs Reviewed - No data to display  Imaging Review No results found.    EKG Interpretation None      MDM   Final diagnoses:  Ear pain, right   50 year old male presents with concern for foreign body in his right ear. On exam his right ear is normal. No evidence of foreign body or TM perforation. Patient verbalized understanding. Patient is NAD, non-toxic, with stable VS. Patient is informed of clinical course, understands medical decision making process, and agrees with plan. Opportunity for questions provided and all questions answered. Return precautions given.  I  personally performed the services described in this documentation, which was scribed in my presence. The recorded information has been reviewed and is accurate.   Bethel Born, PA-C 03/25/16 1610  Arby Barrette, MD 03/25/16 [phone]

## 2016-03-24 NOTE — ED Notes (Signed)
States bug flew into his ear x 2 days ago. States instilled Mineral Oil and attempted to remove w/q-tips. States noted blood on q-tip and wasn't sure if was his for the bug's. States has not felt anything moving in his ear but hasn't seen bug come out either.

## 2016-06-18 ENCOUNTER — Other Ambulatory Visit: Payer: Self-pay | Admitting: Family

## 2016-09-14 ENCOUNTER — Other Ambulatory Visit: Payer: Self-pay | Admitting: Family

## 2016-10-12 ENCOUNTER — Other Ambulatory Visit: Payer: Self-pay | Admitting: Family

## 2016-12-06 ENCOUNTER — Telehealth: Payer: Self-pay | Admitting: *Deleted

## 2016-12-06 NOTE — Telephone Encounter (Signed)
Left msg on triage needing refill on his BP meds. Called pt no answer LMOM will need OV hasn't seen MD since 2016 for renewal,,,.lmb

## 2017-07-06 ENCOUNTER — Emergency Department (HOSPITAL_COMMUNITY): Payer: BLUE CROSS/BLUE SHIELD

## 2017-07-06 ENCOUNTER — Emergency Department (HOSPITAL_COMMUNITY)
Admission: EM | Admit: 2017-07-06 | Discharge: 2017-07-06 | Disposition: A | Discharge status: Home / Self Care | Payer: BLUE CROSS/BLUE SHIELD | Attending: Emergency Medicine | Admitting: Emergency Medicine

## 2017-07-06 ENCOUNTER — Encounter (HOSPITAL_COMMUNITY): Payer: Self-pay | Admitting: Emergency Medicine

## 2017-07-06 DIAGNOSIS — Z79899 Other long term (current) drug therapy: Secondary | ICD-10-CM | POA: Diagnosis not present

## 2017-07-06 DIAGNOSIS — F17210 Nicotine dependence, cigarettes, uncomplicated: Secondary | ICD-10-CM | POA: Diagnosis not present

## 2017-07-06 DIAGNOSIS — M6528 Calcific tendinitis, other site: Secondary | ICD-10-CM

## 2017-07-06 DIAGNOSIS — I10 Essential (primary) hypertension: Secondary | ICD-10-CM | POA: Diagnosis not present

## 2017-07-06 DIAGNOSIS — M79671 Pain in right foot: Secondary | ICD-10-CM | POA: Diagnosis present

## 2017-07-06 DIAGNOSIS — M65261 Calcific tendinitis, right lower leg: Secondary | ICD-10-CM | POA: Insufficient documentation

## 2017-07-06 MED ORDER — AMLODIPINE BESYLATE 5 MG PO TABS: 5.0000 mg | ORAL_TABLET | Freq: Every day | ORAL | 0 refills | Status: AC

## 2017-07-06 MED ORDER — HYDROCODONE-ACETAMINOPHEN 5-325 MG PO TABS: 1.0000 | ORAL_TABLET | Freq: Four times a day (QID) | ORAL | 0 refills | Status: DC | PRN

## 2017-07-06 NOTE — Discharge Instructions (Signed)
Medications: Norco  Treatment: Take 1-2 Norco every 4-6 hours as needed for severe pain. Please check with your primary care provider if you're able to take NSAID medication. If you are, begin taking ibuprofen or Aleve as prescribed over-the-counter in addition. Elevate your leg whenever you're not walking on it. Use ice 3-4 times daily alternating 20 minutes on, 20 minutes off. Wear your boot at all times except with bathing especially with walking.   Do not drink alcohol, drive, operate machinery or participate in any other potentially dangerous activities while taking opiate pain medication as it may make you sleepy. Do not take this medication with any other sedating medications, either prescription or over-the-counter. If you were prescribed Percocet or Vicodin, do not take these with acetaminophen (Tylenol) as it is already contained within these medications and overdose of Tylenol is dangerous.   This medication is an opiate (or narcotic) pain medication and can be habit forming.  Use it as little as possible to achieve adequate pain control.  Do not use or use it with extreme caution if you have a history of opiate abuse or dependence. This medication is intended for your use only - do not give any to anyone else and keep it in a secure place where nobody else, especially children, have access to it. It will also cause or worsen constipation, so you may want to consider taking an over-the-counter stool softener while you are taking this medication.  Follow-up: Please follow-up with Dr. Aundria Rud, an orthopedic doctor, for further evaluation and treatment of your symptoms. Please return to the emergency department if you develop any new or worsening symptoms.

## 2017-07-06 NOTE — ED Triage Notes (Signed)
Pt from home with c/o right foot pain at achilles tendon. Pt states he stepped wrong down stairs about a month ago. Pt states he has had persistent pain since then. Pt rates pain 8/10. Pt states he has also been off his htn medication for about 3 months and is hypertensive at time of assessment  Pt is ambulatory

## 2017-07-06 NOTE — ED Provider Notes (Signed)
[facility]-EMERGENCY DEPT Provider Note   CSN: [id number] Arrival date & time: 07/06/17  1341     History   Chief Complaint Chief Complaint  Patient presents with  . Foot Pain    HPI CARLISS QUAST is a 51 y.o. male with history of hypertension who presents with a 1 month history of right heel pain after fall. He reports it got worse after taking a step wrong a few days ago. He has had swelling to the area and pain into his Achilles tendon. He denies any other foot pain. He denies any numbness or tingling at this time, however has had some intermittently in the area. He has been ambulatory. He is not taking any medications at home for symptoms. He denies any other symptoms including fever.  HPI  Past Medical History:  Diagnosis Date  . Hypertension   . Sleep apnea     Patient Active Problem List   Diagnosis Date Noted  . Essential hypertension 02/13/2007  . FURUNCLE 02/13/2007  . HEADACHE 02/13/2007    History reviewed. No pertinent surgical history.     Home Medications    Prior to Admission medications   Medication Sig Start Date End Date Taking? Authorizing Provider  amLODipine (NORVASC) 5 MG tablet Take 1 tablet (5 mg total) by mouth daily. Needs office visit for more refills 07/06/17   Joy Haegele, Waylan Boga, PA-C  HYDROcodone-acetaminophen (NORCO/VICODIN) 5-325 MG tablet Take 1-2 tablets by mouth every 6 (six) hours as needed for severe pain. 07/06/17   Emi Holes, PA-C    Family History Family History  Problem Relation Age of Onset  . Stroke Mother   . Hypertension Mother   . Lung cancer Father     Social History Social History  Substance Use Topics  . Smoking status: Current Every Day Smoker    Packs/day: 1.00    Years: 20.00    Types: Cigarettes  . Smokeless tobacco: Never Used  . Alcohol use 2.4 oz/week    4 Shots of liquor per week     Comment: week-ends     Allergies   Patient has no known allergies.   Review of Systems Review of Systems    Constitutional: Negative for fever.  Musculoskeletal: Positive for arthralgias and myalgias.  Skin: Negative for rash and wound.     Physical Exam Updated Vital Signs BP (!) 164/112 (BP Location: Left Arm)   Pulse 88   Temp 98.7 F (37.1 C) (Oral)   Resp 16   SpO2 100%   Physical Exam  Constitutional: He appears well-developed and well-nourished. No distress.  HENT:  Head: Normocephalic and atraumatic.  Mouth/Throat: Oropharynx is clear and moist. No oropharyngeal exudate.  Eyes: Pupils are equal, round, and reactive to light. Conjunctivae are normal. Right eye exhibits no discharge. Left eye exhibits no discharge. No scleral icterus.  Neck: Normal range of motion. Neck supple. No thyromegaly present.  Cardiovascular: Normal rate, regular rhythm, normal heart sounds and intact distal pulses.  Exam reveals no gallop and no friction rub.   No murmur heard. Pulmonary/Chest: Effort normal and breath sounds normal. No stridor. No respiratory distress. He has no wheezes. He has no rales.  Musculoskeletal: He exhibits no edema.       Right ankle: Achilles tendon exhibits pain. Achilles tendon exhibits no defect and normal Thompson's test results.       Feet:  Lymphadenopathy:    He has no cervical adenopathy.  Neurological: He is alert. Coordination normal.  Skin:  Skin is warm and dry. No rash noted. He is not diaphoretic. No pallor.  Psychiatric: He has a normal mood and affect.  Nursing note and vitals reviewed.    ED Treatments / Results  Labs (all labs ordered are listed, but only abnormal results are displayed) Labs Reviewed - No data to display  EKG  EKG Interpretation None       Radiology Dg Ankle Complete Right  Result Date: 07/06/2017 CLINICAL DATA:  Pain over the Achilles after fall 1 month ago. EXAM: RIGHT ANKLE - COMPLETE 3+ VIEW COMPARISON:  None. FINDINGS: There is motor soft tissue swelling about the malleoli. The ankle mortise is maintained without  widening. Calcaneal enthesopathy is noted along the plantar and dorsal aspect of the calcaneus, more so dorsally with small ossification along the course of the distal Achilles tendon that may reflect calcific tendinopathy. No ankle or subtalar joint effusions. No acute displaced fracture. No dislocations. IMPRESSION: 1. Diffuse soft tissue swelling of the ankle about the malleoli. No acute fracture or dislocations. 2. Enthesopathy off the calcaneus more so along the posterior aspect. 3. Possible calcific Achilles tendinopathy. Electronically Signed   By: Tollie Eth M.D.   On: 07/06/2017 16:44    Procedures Procedures (including critical care time)  Medications Ordered in ED Medications - No data to display   Initial Impression / Assessment and Plan / ED Course  I have reviewed the triage vital signs and the nursing notes.  Pertinent labs & imaging results that were available during my care of the patient were reviewed by me and considered in my medical decision making (see chart for details).     Patient with diffuse soft tissue swelling of the ankle about the malleoli, no   acute fracture or dislocation; these up at the off the calcaneus more so along the posterior aspect; and possible calcific Achilles tendinopathy. Patient is neurovascularly intact. No defect noted in the acuities, negative Thompson test, however partial rupture cannot be ruled out. We'll place in cam walker boot and follow-up to orthopedics. Patient reports he is unable to take NSAIDs due to his hypertension. I will discharge home with short courses Norco. I reviewed the [place] narcotic database and found her discrepancies. I also asked the patient to ask his doctor if he could have a short course of NSAIDs as this would help his symptoms significantly. Return precautions discussed. Patient understands and agrees with plan. Patient discharged in satisfactory condition. I also refilled patient's hypertension medication, as he has  been out of it for 3 months.  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

## 2017-10-06 IMAGING — CR DG ANKLE COMPLETE 3+V*R*
4 series · 4 of 4 positions shown · non-contrast
Comparison: None.

CLINICAL DATA: Pain over the Achilles after fall 1 month ago.

EXAM:
RIGHT ANKLE - COMPLETE 3+ VIEW

[x ankle ap right]
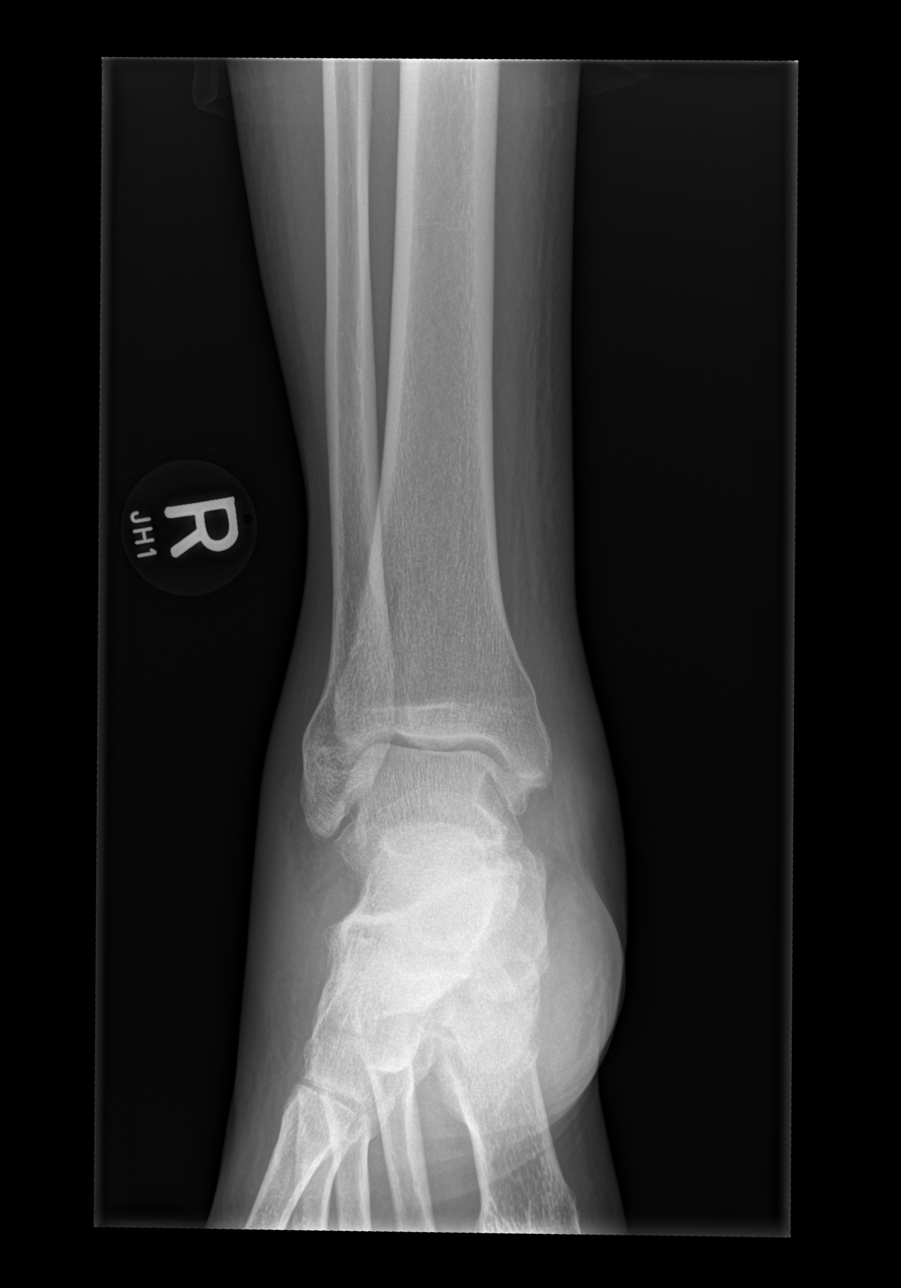

[x ankle obl right (1 of 2)]
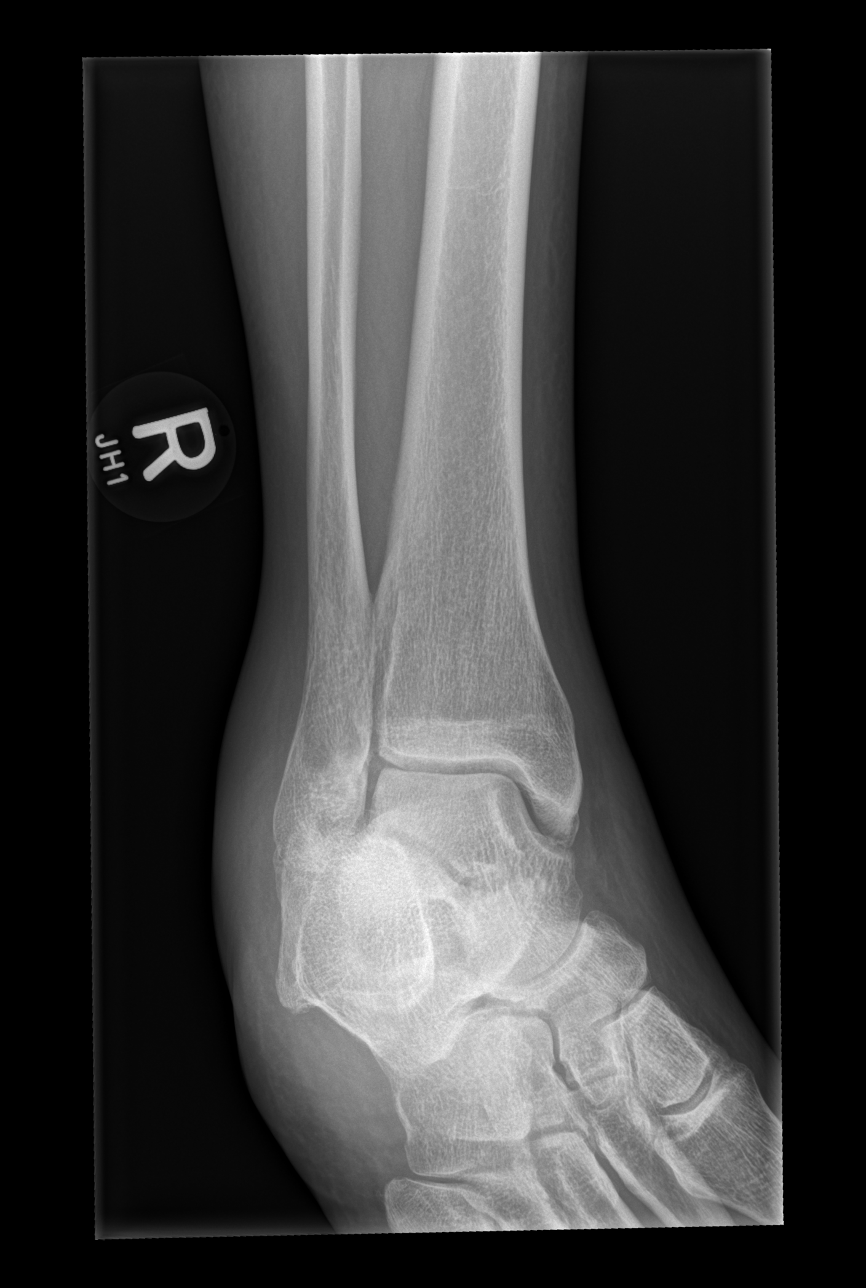

[x ankle obl right (2 of 2)]
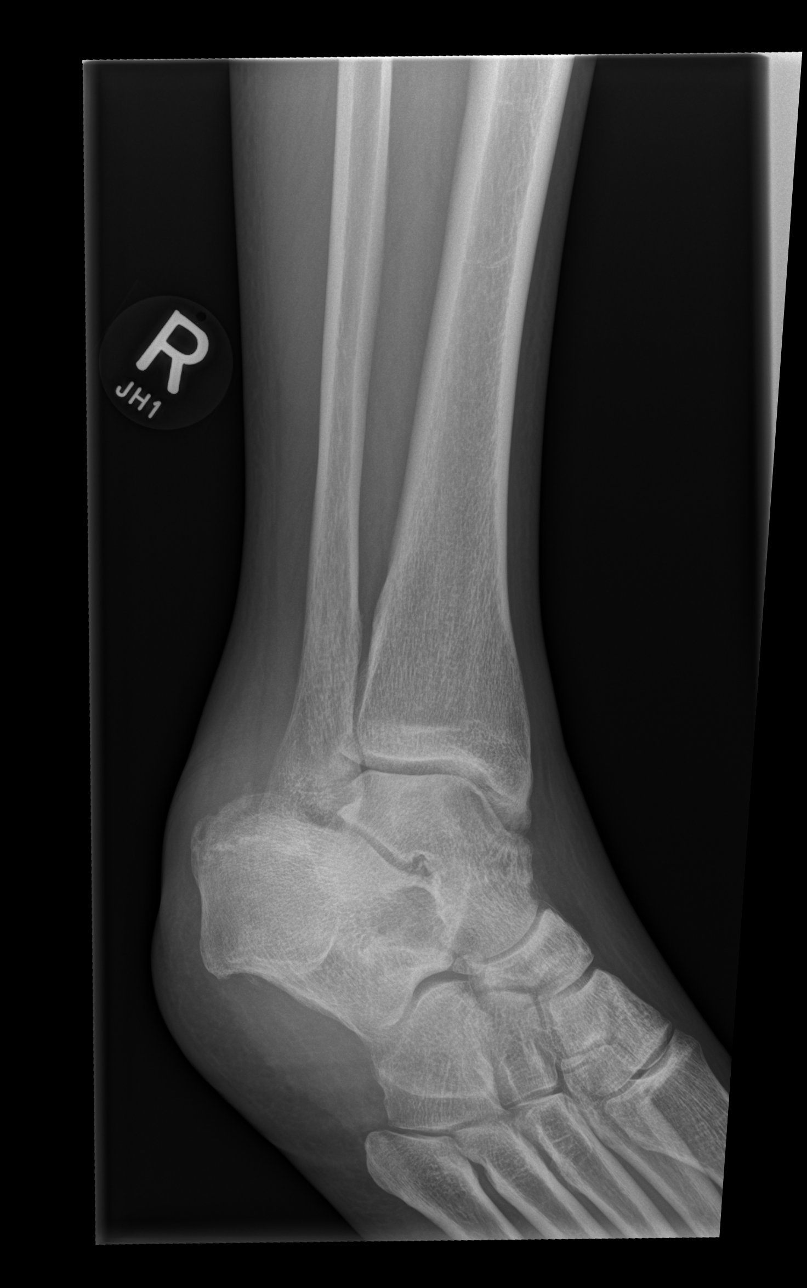

[x ankle lat right]
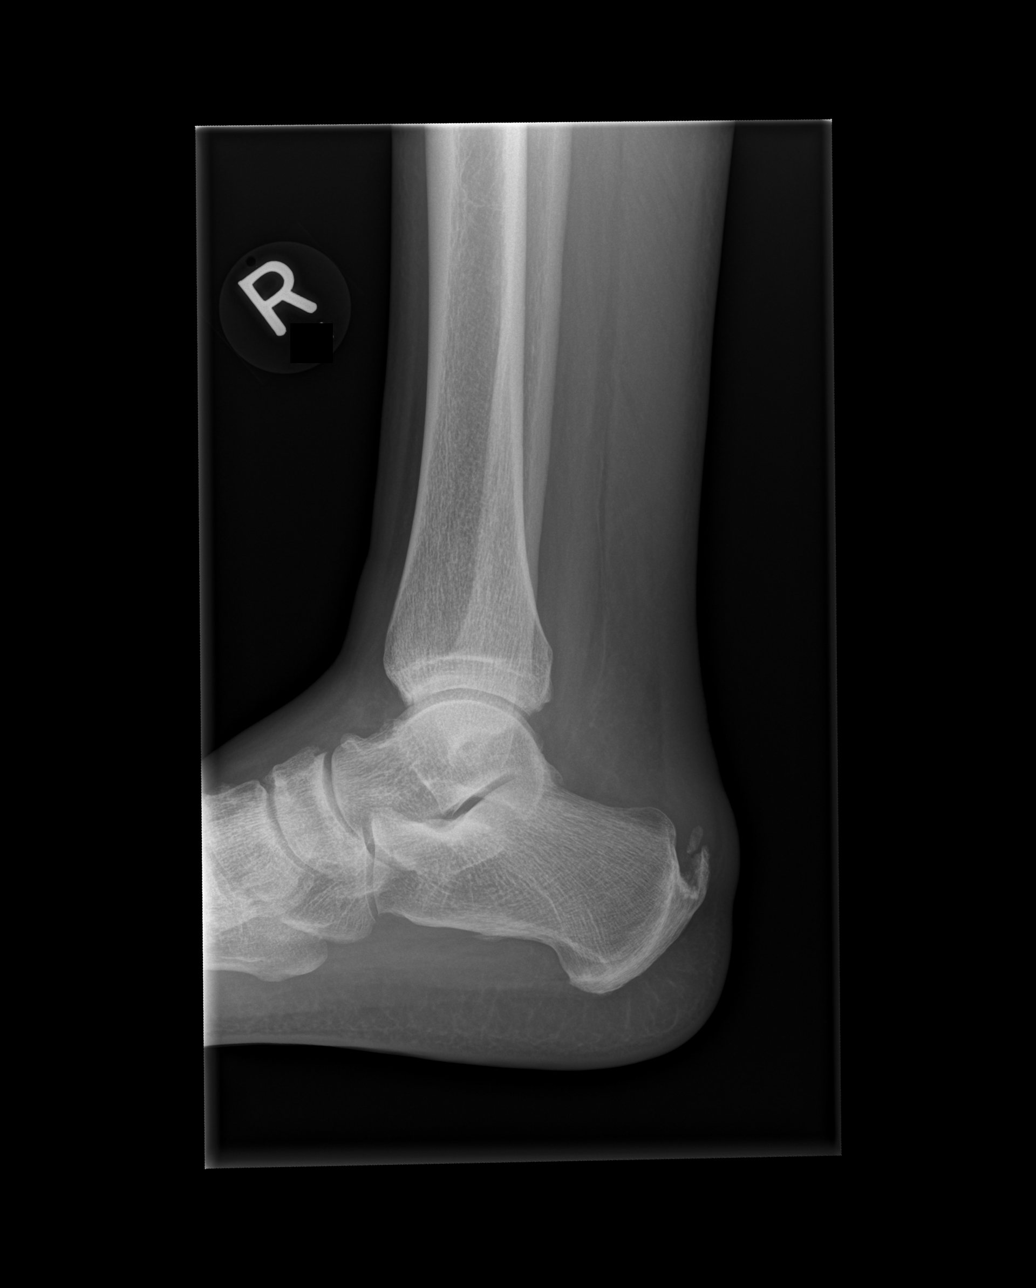

[4 of 4 positions shown; findings below may reference images not displayed]

FINDINGS: There is motor soft tissue swelling about the malleoli. The ankle
mortise is maintained without widening. Calcaneal enthesopathy is
noted along the plantar and dorsal aspect of the calcaneus, more so
dorsally with small ossification along the course of the distal
Achilles tendon that may reflect calcific tendinopathy. No ankle or
subtalar joint effusions. No acute displaced fracture. No
dislocations.
IMPRESSION: 1. Diffuse soft tissue swelling of the ankle about the malleoli. No
acute fracture or dislocations.
2. Enthesopathy off the calcaneus more so along the posterior
aspect.
3. Possible calcific Achilles tendinopathy.

## 2023-04-17 ENCOUNTER — Emergency Department (HOSPITAL_BASED_OUTPATIENT_CLINIC_OR_DEPARTMENT_OTHER): Payer: PRIVATE HEALTH INSURANCE

## 2023-04-17 ENCOUNTER — Other Ambulatory Visit (HOSPITAL_BASED_OUTPATIENT_CLINIC_OR_DEPARTMENT_OTHER): Payer: Self-pay

## 2023-04-17 ENCOUNTER — Encounter (HOSPITAL_BASED_OUTPATIENT_CLINIC_OR_DEPARTMENT_OTHER): Payer: Self-pay

## 2023-04-17 ENCOUNTER — Emergency Department (HOSPITAL_BASED_OUTPATIENT_CLINIC_OR_DEPARTMENT_OTHER)
Admission: EM | Admit: 2023-04-17 | Discharge: 2023-04-17 | Disposition: A | Discharge status: Home / Self Care | Payer: PRIVATE HEALTH INSURANCE | Attending: Emergency Medicine | Admitting: Emergency Medicine

## 2023-04-17 ENCOUNTER — Other Ambulatory Visit: Payer: Self-pay

## 2023-04-17 DIAGNOSIS — K863 Pseudocyst of pancreas: Secondary | ICD-10-CM | POA: Diagnosis not present

## 2023-04-17 DIAGNOSIS — K279 Peptic ulcer, site unspecified, unspecified as acute or chronic, without hemorrhage or perforation: Secondary | ICD-10-CM | POA: Diagnosis not present

## 2023-04-17 DIAGNOSIS — R1011 Right upper quadrant pain: Secondary | ICD-10-CM | POA: Diagnosis present

## 2023-04-17 LAB — COMPREHENSIVE METABOLIC PANEL
ALT: 20 U/L (ref 0–44)
AST: 24 U/L (ref 15–41)
Albumin: 4.2 g/dL (ref 3.5–5.0)
Alkaline Phosphatase: 76 U/L (ref 38–126)
Anion gap: 8 (ref 5–15)
BUN: 9 mg/dL (ref 6–20)
CO2: 27 mmol/L (ref 22–32)
Calcium: 9.3 mg/dL (ref 8.9–10.3)
Chloride: 102 mmol/L (ref 98–111)
Creatinine, Ser: 0.88 mg/dL (ref 0.61–1.24)
GFR, Estimated: >60 mL/min (ref >60)
Glucose, Bld: 93 mg/dL (ref 70–99)
Potassium: 4.1 mmol/L (ref 3.5–5.1)
Sodium: 137 mmol/L (ref 135–145)
Total Bilirubin: 0.3 mg/dL (ref 0.3–1.2)
Total Protein: 7.4 g/dL (ref 6.5–8.1)

## 2023-04-17 LAB — URINALYSIS, ROUTINE W REFLEX MICROSCOPIC
Bacteria, UA: NONE SEEN
Bilirubin Urine: NEGATIVE
Glucose, UA: NEGATIVE mg/dL
Hgb urine dipstick: NEGATIVE
Leukocytes,Ua: NEGATIVE
Nitrite: NEGATIVE
Protein, ur: 30 mg/dL — AB
Specific Gravity, Urine: 1.035 — ABNORMAL HIGH (ref 1.005–1.030)
pH: 7 (ref 5.0–8.0)

## 2023-04-17 LAB — CBC
HCT: 44.5 % (ref 39.0–52.0)
Hemoglobin: 14.6 g/dL (ref 13.0–17.0)
MCH: 28.8 pg (ref 26.0–34.0)
MCHC: 32.8 g/dL (ref 30.0–36.0)
MCV: 87.8 fL (ref 80.0–100.0)
Platelets: 252 10*3/uL (ref 150–400)
RBC: 5.07 MIL/uL (ref 4.22–5.81)
RDW: 16.1 % — ABNORMAL HIGH (ref 11.5–15.5)
WBC: 10.8 10*3/uL — ABNORMAL HIGH (ref 4.0–10.5)
nRBC: 0 % (ref 0.0–0.2)

## 2023-04-17 LAB — LIPASE, BLOOD: Lipase: 66 U/L — ABNORMAL HIGH (ref 11–51)

## 2023-04-17 MED ORDER — ONDANSETRON 4 MG PO TBDP
4.0000 mg | ORAL_TABLET | Freq: Three times a day (TID) | ORAL | 0 refills | Status: DC | PRN
  Filled 2023-04-17: qty 10, 4d supply, fill #0

## 2023-04-17 MED ORDER — SODIUM CHLORIDE 0.9 % IV BOLUS
1000.0000 mL | Freq: Once | INTRAVENOUS | Status: AC
  Administered 2023-04-17: 1000 mL via INTRAVENOUS

## 2023-04-17 MED ORDER — LIDOCAINE VISCOUS HCL 2 % MT SOLN
15.0000 mL | Freq: Once | OROMUCOSAL | Status: AC
  Administered 2023-04-17: 15 mL via ORAL
  Filled 2023-04-17: qty 15

## 2023-04-17 MED ORDER — ALUM & MAG HYDROXIDE-SIMETH 200-200-20 MG/5ML PO SUSP
30.0000 mL | Freq: Once | ORAL | Status: AC
  Administered 2023-04-17: 30 mL via ORAL
  Filled 2023-04-17: qty 30

## 2023-04-17 MED ORDER — IOHEXOL 300 MG/ML  SOLN
100.0000 mL | Freq: Once | INTRAMUSCULAR | Status: AC | PRN
  Administered 2023-04-17: 100 mL via INTRAVENOUS

## 2023-04-17 MED ORDER — PANTOPRAZOLE SODIUM 20 MG PO TBEC
20.0000 mg | DELAYED_RELEASE_TABLET | Freq: Two times a day (BID) | ORAL | 0 refills | Status: DC
  Filled 2023-04-17: qty 60, 30d supply, fill #0

## 2023-04-17 MED ORDER — PANTOPRAZOLE SODIUM 40 MG IV SOLR
40.0000 mg | Freq: Once | INTRAVENOUS | Status: AC
  Administered 2023-04-17: 40 mg via INTRAVENOUS
  Filled 2023-04-17: qty 10

## 2023-04-17 NOTE — ED Triage Notes (Signed)
Patient reports having pain in leff upper quad especially after alcohol for the past couple weeks. Hx of pancreatitis years ago.  Last drink was 3 weeks ago and has been hurting off an on.  +n

## 2023-04-17 NOTE — Discharge Instructions (Addendum)
Please read and follow all provided instructions.  Your diagnoses today include:  1. Peptic ulcer disease     Tests performed today include: Blood cell counts and platelets Kidney and liver function tests Pancreas function test (called lipase): Minimally elevated Your CT scan showed a large stomach ulcer and a pancreatic pseudocyst.  This will require follow-up as an outpatient.  You will need an endoscopy most likely. Vital signs. See below for your results today.   Medications prescribed:  Pantoprazole - stomach acid reducer to take twice a day for 1 month  Zofran (ondansetron) - for nausea and vomiting  Take any prescribed medications only as directed.  Home care instructions:  Follow any educational materials contained in this packet.  Your abdominal pain, nausea, vomiting, and diarrhea may be caused by a viral gastroenteritis also called 'stomach flu'. You should rest for the next several days. Keep drinking plenty of fluids and use the medicine for nausea as directed.   Drink clear liquids for the next 24 hours and introduce solid foods slowly after 24 hours using the b.r.a.t. diet (Bananas, Rice, Applesauce, Toast, Yogurt).    Follow-up instructions: Please follow-up with your primary care provider in the next 7 days for further evaluation of your symptoms. Please also call the gastroenterologist doctor listed for follow-up.   Return instructions:  SEEK IMMEDIATE MEDICAL ATTENTION IF: If you have pain that does not go away or becomes severe  A temperature above 101F develops  Repeated vomiting occurs (multiple episodes)  If you have pain that becomes localized to portions of the abdomen. The right side could possibly be appendicitis. In an adult, the left lower portion of the abdomen could be colitis or diverticulitis.  Blood is being passed in stools or vomit (bright red or black tarry stools)  You develop chest pain, difficulty breathing, dizziness or fainting, or become  confused, poorly responsive, or inconsolable (young children) If you have any other emergent concerns regarding your health  Additional Information: Abdominal (belly) pain can be caused by many things. Your caregiver performed an examination and possibly ordered blood/urine tests and imaging (CT scan, x-rays, ultrasound). Many cases can be observed and treated at home after initial evaluation in the emergency department. Even though you are being discharged home, abdominal pain can be unpredictable. Therefore, you need a repeated exam if your pain does not resolve, returns, or worsens. Most patients with abdominal pain don't have to be admitted to the hospital or have surgery, but serious problems like appendicitis and gallbladder attacks can start out as nonspecific pain. Many abdominal conditions cannot be diagnosed in one visit, so follow-up evaluations are very important.  Your vital signs today were: BP (!) 159/101 (BP Location: Right Arm)   Pulse 96   Temp 98.3 F (36.8 C) (Oral)   Resp 18   Ht 6\' 1"  (1.854 m)   Wt 84.8 kg   SpO2 100%   BMI 24.67 kg/m  If your blood pressure (bp) was elevated above 135/85 this visit, please have this repeated by your doctor within one month. --------------

## 2023-04-17 NOTE — ED Notes (Signed)
Pt discharged to home, NAD noted 

## 2023-04-17 NOTE — ED Provider Notes (Signed)
[place] EMERGENCY DEPARTMENT AT [facility] Provider Note   CSN: [id number] Arrival date & time: 04/17/23  1316     History  Chief Complaint  Patient presents with   Abdominal Pain    [name] [name] is a 56 y.o. male.  Patient with a history of pancreatitis, no previous abdominal surgery history presents to the ED with intermittent epigastric and RUQ abdominal pain for 3 weeks. Patient describes his abdominal pain as "achy" and came on after drinking alcohol. Patient states he usually has "a few drinks" everyday after work but stopped drinking 3 weeks ago due to the pain. Patient states the pain has no association with food and denies radiating pain to the chest, back or arm. Patient reports nothing makes his pain worse except for alcohol. Patient states using a heating pad and Pepto-Bismol chewables provides some relief. Patient denies ibuprofen or other NSAID use. Patient endorses nausea and denies fever, chills, chest pain, SOB, V/D, or hematuria.       Home Medications Prior to Admission medications   Medication Sig Start Date End Date Taking? Authorizing Provider  amLODipine (NORVASC) 5 MG tablet Take 1 tablet (5 mg total) by mouth daily. Needs office visit for more refills 07/06/17   Law, Waylan Boga, PA-C  HYDROcodone-acetaminophen (NORCO/VICODIN) 5-325 MG tablet Take 1-2 tablets by mouth every 6 (six) hours as needed for severe pain. 07/06/17   Emi Holes, PA-C      Allergies    Patient has no known allergies.    Review of Systems   Review of Systems  Physical Exam Updated Vital Signs BP (!) 159/101 (BP Location: Right Arm)   Pulse 96   Temp 98.3 F (36.8 C) (Oral)   Resp 18   Ht 6\' 1"  (1.854 m)   Wt 84.8 kg   SpO2 100%   BMI 24.67 kg/m  Physical Exam Vitals and nursing note reviewed.  Constitutional:      General: He is not in acute distress.    Appearance: He is well-developed.  HENT:     Head: Normocephalic and atraumatic.  Eyes:      General:        Right eye: No discharge.        Left eye: No discharge.     Conjunctiva/sclera: Conjunctivae normal.  Cardiovascular:     Rate and Rhythm: Normal rate and regular rhythm.     Heart sounds: Normal heart sounds.  Pulmonary:     Effort: Pulmonary effort is normal.     Breath sounds: Normal breath sounds.  Abdominal:     Palpations: Abdomen is soft.     Tenderness: There is abdominal tenderness in the right upper quadrant, epigastric area and left upper quadrant. There is no guarding or rebound. Negative signs include Murphy's sign and McBurney's sign.  Musculoskeletal:     Cervical back: Normal range of motion and neck supple.  Skin:    General: Skin is warm and dry.  Neurological:     Mental Status: He is alert.     ED Results / Procedures / Treatments   Labs (all labs ordered are listed, but only abnormal results are displayed) Labs Reviewed  LIPASE, BLOOD - Abnormal; Notable for the following components:      Result Value   Lipase 66 (*)    All other components within normal limits  CBC - Abnormal; Notable for the following components:   WBC 10.8 (*)    RDW 16.1 (*)  All other components within normal limits  URINALYSIS, ROUTINE W REFLEX MICROSCOPIC - Abnormal; Notable for the following components:   Specific Gravity, Urine 1.035 (*)    Ketones, ur TRACE (*)    Protein, ur 30 (*)    All other components within normal limits  COMPREHENSIVE METABOLIC PANEL    EKG None  Radiology CT ABDOMEN PELVIS W CONTRAST  Result Date: 04/17/2023 CLINICAL DATA:  Abdominal pain, acute, nonlocalized upper abd pain, bilateral, 3 weeks, daily alcohol use. Left upper quadrant pain. History of pancreatitis. EXAM: CT ABDOMEN AND PELVIS WITH CONTRAST TECHNIQUE: Multidetector CT imaging of the abdomen and pelvis was performed using the standard protocol following bolus administration of intravenous contrast. RADIATION DOSE REDUCTION: This exam was performed according to the  departmental dose-optimization program which includes automated exposure control, adjustment of the mA and/or kV according to patient size and/or use of iterative reconstruction technique. CONTRAST:  OMNIPAQUE IOHEXOL 300 MG/ML  SOLN COMPARISON:  None Available. FINDINGS: Lower chest: No acute abnormality Hepatobiliary: No focal hepatic abnormality. Gallbladder unremarkable. Pancreas: Small cyst in the uncinate process measuring 1.8 cm, likely pseudocyst. Mild haziness/inflammation anterior to the pancreas between the pancreas and posterior gastric wall. No ductal dilatation. Spleen: Normal size. Small calcified area within the spleen measures 12 mm, likely calcified cyst. Adrenals/Urinary Tract: No adrenal abnormality. No focal renal abnormality. No stones or hydronephrosis. Urinary bladder is unremarkable. Stomach/Bowel: Normal appendix. Large ulcer along the posterior gastric wall measuring 2.1 x 1.8 cm on image 19 of series 2. Surrounding inflammation between the stomach and the pancreas. Large and small bowel unremarkable. Vascular/Lymphatic: No evidence of aneurysm. Mildly prominent peripancreatic and retroperitoneal lymph nodes. Left periaortic lymph node has a short axis diameter of 10 mm on image 26 of series 2. These are likely reactive. Reproductive: No visible focal abnormality. Other: No free fluid or free air. Musculoskeletal: Degenerative changes in the lumbar spine. No acute bony abnormality. IMPRESSION: Haziness/inflammation between the pancreas and posterior gastric wall. There appears to be a large ulcer along the posterior gastric wall measuring up to 2.1 cm. It is difficult to determine if this could be a large ulcer related to peptic ulcer disease or ulcerated gastric wall mass (favored as the posterior gastric wall appears thickened). It is also difficult to determine if the inflammation between the stomach and pancreas is related to this large ulcer or pancreatitis. Recommend further  evaluation of the gastric ulceration with endoscopy. Small cyst in the uncinate process of the pancreas compatible with pseudocyst. Peripancreatic and retroperitoneal prominent lymph nodes, likely reactive. Electronically Signed   By: Charlett Nose M.D.   On: 04/17/2023 15:32    Procedures Procedures    Medications Ordered in ED Medications  alum & mag hydroxide-simeth (MAALOX/MYLANTA) 200-200-20 MG/5ML suspension 30 mL (has no administration in time range)    And  lidocaine (XYLOCAINE) 2 % viscous mouth solution 15 mL (has no administration in time range)    ED Course/ Medical Decision Making/ A&P    Patient seen and examined. History obtained directly from patient. Work-up including labs, imaging, EKG ordered in triage, if performed, were reviewed.    Labs/EKG: Independently reviewed and interpreted.  This included: CBC with blood cell count minimally elevated at 10.8 otherwise unremarkable; CMP unremarkable; lipase minimally elevated at 66.  Imaging: Discussed imaging with patient at bedside.  Given chronic nature of the patient's symptoms in setting of chronic alcohol use, no recent imaging, will proceed with CT imaging to evaluate the upper  abdomen.  Medications/Fluids: Ordered: GI cocktail  Most recent vital signs reviewed and are as follows: BP (!) 159/101 (BP Location: Right Arm)   Pulse 96   Temp 98.3 F (36.8 C) (Oral)   Resp 18   Ht 6\' 1"  (1.854 m)   Wt 84.8 kg   SpO2 100%   BMI 24.67 kg/m   Initial impression: Epigastric pain in setting of alcohol use, possible gastritis, PUD, chronic pancreatitis.  Lower concern for acute pancreatitis, cholecystitis, cholelithiasis, cholangitis, bowel perforation.  4:42 PM Reassessment performed. Patient appears stable.  Imaging personally visualized and interpreted including: CT scan, agree likely peptic ulcer disease, inflammation between the stomach and pancreas, pancreatic pseudocyst noted.  Reviewed pertinent lab work and  imaging with patient at bedside.  We discussed CT findings including stomach ulcer and pancreatic pseudocyst and talked about what these were.  Questions answered.   I did discuss the case by telephone with Dr. Ewing Schlein who is on-call for GI.  We discussed appropriate treatment initially for this to include twice daily Protonix, lifestyle modifications including cutting back on alcohol and smoking.  Agrees with outpatient follow-up and that patient will need endoscopy down the line.  Patient informed of these recommendations.  I also spoke with site manager who informing that patient was dismissed from [facility] as of 2015.  I have placed social worker/TOC consult for primary care needs.  Most current vital signs reviewed and are as follows: BP (!) 159/101 (BP Location: Right Arm)   Pulse 96   Temp 98.3 F (36.8 C) (Oral)   Resp 18   Ht 6\' 1"  (1.854 m)   Wt 84.8 kg   SpO2 100%   BMI 24.67 kg/m   Plan: Discharge to home.  Patient voices comfort with discharge home today.  He does not want to be admitted to the hospital.  In fact he states that he has things that he needs to do at his job and requests discharge in the next 30 minutes.  Prescriptions written for: Protonix, Zofran  Other home care instructions discussed: Diet modification, continuing and maintaining cessation of alcohol, decreasing tobacco use.  ED return instructions discussed: The patient was urged to return to the Emergency Department immediately with worsening of current symptoms, worsening abdominal pain, persistent vomiting, blood noted in stools, fever, or any other concerns. The patient verbalized understanding.   Follow-up instructions discussed: Patient encouraged to follow-up with their PCP in 7 days.                             Medical Decision Making Amount and/or Complexity of Data Reviewed Labs: ordered. Radiology: ordered.  Risk OTC drugs. Prescription drug management.   For this patient's  complaint of abdominal pain, the following conditions were considered on the differential diagnosis: gastritis/PUD, enteritis/duodenitis, appendicitis, cholelithiasis/cholecystitis, cholangitis, pancreatitis, ruptured viscus, colitis, diverticulitis, small/large bowel obstruction, proctitis, cystitis, pyelonephritis, ureteral colic, aortic dissection, aortic aneurysm. Atypical chest etiologies were also considered including ACS, PE, and pneumonia.  The patient's vital signs, pertinent lab work and imaging were reviewed and interpreted as discussed in the ED course. Hospitalization was considered for further testing, treatments, or serial exams/observation. However as patient is well-appearing, has a stable exam, and reassuring studies today, I do not feel that they warrant admission at this time. This plan was discussed with the patient who verbalizes agreement and comfort with this plan and seems reliable and able to return to the Emergency Department  with worsening or changing symptoms.          Final Clinical Impression(s) / ED Diagnoses Final diagnoses:  Peptic ulcer disease  Pancreatic pseudocyst    Rx / DC Orders ED Discharge Orders          Ordered    pantoprazole (PROTONIX) 20 MG tablet  2 times daily        04/17/23 1633              Renne Crigler, PA-C 04/17/23 1646    Derwood Kaplan, MD 04/18/23 [phone]

## 2023-05-06 ENCOUNTER — Encounter: Payer: Self-pay | Admitting: Physician Assistant

## 2023-06-05 ENCOUNTER — Ambulatory Visit (HOSPITAL_COMMUNITY)
Admission: RE | Admit: 2023-06-05 | Discharge: 2023-06-05 | Disposition: A | Discharge status: Home / Self Care | Payer: BC Managed Care – PPO | Source: Ambulatory Visit | Attending: Family Medicine | Admitting: Family Medicine

## 2023-06-05 ENCOUNTER — Encounter (HOSPITAL_COMMUNITY): Payer: Self-pay

## 2023-06-05 VITALS — BP 162/101 | HR 106 | Temp 98.4°F | Resp 18

## 2023-06-05 DIAGNOSIS — N489 Disorder of penis, unspecified: Secondary | ICD-10-CM | POA: Diagnosis present

## 2023-06-05 DIAGNOSIS — I10 Essential (primary) hypertension: Secondary | ICD-10-CM | POA: Diagnosis present

## 2023-06-05 MED ORDER — LIDOCAINE HCL 2 % IJ SOLN
INTRAMUSCULAR | Status: AC
  Filled 2023-06-05: qty 20

## 2023-06-05 MED ORDER — IBUPROFEN 800 MG PO TABS: 800.0000 mg | ORAL_TABLET | Freq: Three times a day (TID) | ORAL | 0 refills | Status: DC

## 2023-06-05 NOTE — ED Triage Notes (Signed)
Pt states he has a skin tag on his penis he would like removed.

## 2023-06-05 NOTE — ED Provider Notes (Signed)
[facility]   [id number] 06/05/23 Arrival Time: 1529  ASSESSMENT & PLAN:  1. Penile lesion   2. Elevated blood pressure reading with diagnosis of hypertension    Shave Biopsy Procedure Note  Anesthesia: 1% plain lidocaine  Procedure Details  The procedure, risks and complications have been discussed in detail (including, but not limited to pain and bleeding) with the patient.  The skin induration was prepped and draped in the usual fashion. After small wheal of local anesthesia under base of presumed skin tag, a shave biopsy was performed with a #11 blade. Minimal blood loss. Bleeding controlled with silver nitrate. No complications.  Meds ordered this encounter  Medications   ibuprofen (ADVIL) 800 MG tablet    Sig: Take 1 tablet (800 mg total) by mouth 3 (three) times daily with meals.    Dispense:  21 tablet    Refill:  0   Specimen sent ito pathology in labeled formalin container. Pending:  SURGICAL PATHOLOGY   May f/u here as needed.  Your blood pressure was noted to be elevated during your visit today. If you are currently taking medication for high blood pressure, please ensure you are taking this as directed. If you do not have a history of high blood pressure and your blood pressure remains persistently elevated, you may need to begin taking a medication at some point. You may return here within the next few days to recheck if unable to see your primary care provider or if you do not have a one.  BP (!) 162/101 (BP Location: Left Arm)   Pulse (!) 106   Temp 98.4 F (36.9 C) (Oral)   Resp 18   SpO2 96%   BP Readings from Last 3 Encounters:  06/05/23 (!) 162/101  04/17/23 (!) 159/101  07/06/17 (!) 164/112  No symptoms.  Reviewed expectations re: course of current medical issues. Questions answered. Outlined signs and symptoms indicating need for more acute intervention. Patient verbalized understanding. After Visit Summary  given.   SUBJECTIVE:  [name] [name] is a 57 y.o. male who reports fleshy growth on mid-shaft of penis; x many years; no changes; no bleeding; size stable as long as he can remember. Occasionally gets irritated/inflamed and uncomfortable.  Increased blood pressure noted today. Reports that he is treated for HTN. No symptoms.  OBJECTIVE:  Vitals:   06/05/23 1559  BP: (!) 162/101  Pulse: (!) 106  Resp: 18  Temp: 98.4 F (36.9 C)  TempSrc: Oral  SpO2: 96%    Slight tachycardia noted. General appearance: alert; no distress Penis: approx 0.5 cm flesh-colored growth on superior mid-shaft of penis; without bleeding; non-tender Psychological: alert and cooperative; normal mood and affect  No Known Allergies  Past Medical History:  Diagnosis Date   Hypertension    Sleep apnea    Social History   Socioeconomic History   Marital status: Married    Spouse name: Not on file   Number of children: 1   Years of education: 14   Highest education level: Not on file  Occupational History   Occupation: Production  Tobacco Use   Smoking status: Every Day    Current packs/day: 1.00    Average packs/day: 1 pack/day for 20.0 years (20.0 ttl pk-yrs)    Types: Cigarettes   Smokeless tobacco: Never  Vaping Use   Vaping status: Never Used  Substance and Sexual Activity   Alcohol use: Yes    Alcohol/week: 4.0 standard drinks of alcohol    Types:  4 Shots of liquor per week    Comment: week-ends   Drug use: No   Sexual activity: Not on file  Other Topics Concern   Not on file  Social History Narrative   Fun: Be off of work and be with family.   Social Determinants of Health   Financial Resource Strain: Not on file  Food Insecurity: Not on file  Transportation Needs: Not on file  Physical Activity: Not on file  Stress: Not on file  Social Connections: Not on file   Family History  Problem Relation Age of Onset   Stroke Mother    Hypertension Mother    Lung cancer Father     History reviewed. No pertinent surgical history.          Mardella Layman, MD 06/05/23 [phone]

## 2023-06-05 NOTE — Discharge Instructions (Addendum)
You have had labs (pathology evaluation of skin lesion excised today) sent today. We will call you with any significant abnormalities or if there is need to begin or change treatment or pursue further follow up.  You may also review your test results online through MyChart. If you do not have a MyChart account, instructions to sign up should be on your discharge paperwork.  If not allergic, you may use over the counter ibuprofen or acetaminophen as needed.  Your blood pressure was noted to be elevated during your visit today. If you are currently taking medication for high blood pressure, please ensure you are taking this as directed. If you do not have a history of high blood pressure and your blood pressure remains persistently elevated, you may need to begin taking a medication at some point. You may return here within the next few days to recheck if unable to see your primary care provider or if you do not have a one.  BP (!) 162/101 (BP Location: Left Arm)   Pulse (!) 106   Temp 98.4 F (36.9 C) (Oral)   Resp 18   SpO2 96%   BP Readings from Last 3 Encounters:  06/05/23 (!) 162/101  04/17/23 (!) 159/101  07/06/17 (!) 164/112

## 2023-06-09 LAB — SURGICAL PATHOLOGY

## 2023-06-16 ENCOUNTER — Encounter (HOSPITAL_BASED_OUTPATIENT_CLINIC_OR_DEPARTMENT_OTHER): Payer: Self-pay | Admitting: Emergency Medicine

## 2023-06-16 ENCOUNTER — Emergency Department (HOSPITAL_BASED_OUTPATIENT_CLINIC_OR_DEPARTMENT_OTHER)
Admission: EM | Admit: 2023-06-16 | Discharge: 2023-06-16 | Disposition: A | Discharge status: Home / Self Care | Payer: BC Managed Care – PPO | Attending: Emergency Medicine | Admitting: Emergency Medicine

## 2023-06-16 ENCOUNTER — Other Ambulatory Visit: Payer: Self-pay

## 2023-06-16 ENCOUNTER — Emergency Department (HOSPITAL_BASED_OUTPATIENT_CLINIC_OR_DEPARTMENT_OTHER): Payer: BC Managed Care – PPO

## 2023-06-16 DIAGNOSIS — K859 Acute pancreatitis without necrosis or infection, unspecified: Secondary | ICD-10-CM | POA: Insufficient documentation

## 2023-06-16 DIAGNOSIS — D72829 Elevated white blood cell count, unspecified: Secondary | ICD-10-CM | POA: Diagnosis not present

## 2023-06-16 DIAGNOSIS — Z79899 Other long term (current) drug therapy: Secondary | ICD-10-CM | POA: Insufficient documentation

## 2023-06-16 DIAGNOSIS — F17210 Nicotine dependence, cigarettes, uncomplicated: Secondary | ICD-10-CM | POA: Insufficient documentation

## 2023-06-16 DIAGNOSIS — I10 Essential (primary) hypertension: Secondary | ICD-10-CM | POA: Insufficient documentation

## 2023-06-16 DIAGNOSIS — K863 Pseudocyst of pancreas: Secondary | ICD-10-CM | POA: Insufficient documentation

## 2023-06-16 DIAGNOSIS — R1011 Right upper quadrant pain: Secondary | ICD-10-CM | POA: Diagnosis present

## 2023-06-16 LAB — CBC
HCT: 42.3 % (ref 39.0–52.0)
Hemoglobin: 13.7 g/dL (ref 13.0–17.0)
MCH: 28.4 pg (ref 26.0–34.0)
MCHC: 32.4 g/dL (ref 30.0–36.0)
MCV: 87.8 fL (ref 80.0–100.0)
Platelets: 370 10*3/uL (ref 150–400)
RBC: 4.82 MIL/uL (ref 4.22–5.81)
RDW: 16 % — ABNORMAL HIGH (ref 11.5–15.5)
WBC: 11.4 10*3/uL — ABNORMAL HIGH (ref 4.0–10.5)
nRBC: 0 % (ref 0.0–0.2)

## 2023-06-16 LAB — URINALYSIS, ROUTINE W REFLEX MICROSCOPIC
Bacteria, UA: NONE SEEN
Bilirubin Urine: NEGATIVE
Glucose, UA: NEGATIVE mg/dL
Hgb urine dipstick: NEGATIVE
Ketones, ur: NEGATIVE mg/dL
Leukocytes,Ua: NEGATIVE
Nitrite: NEGATIVE
Protein, ur: 30 mg/dL — AB
Specific Gravity, Urine: 1.031 — ABNORMAL HIGH (ref 1.005–1.030)
pH: 8 (ref 5.0–8.0)

## 2023-06-16 LAB — COMPREHENSIVE METABOLIC PANEL
ALT: 7 U/L (ref 0–44)
AST: 13 U/L — ABNORMAL LOW (ref 15–41)
Albumin: 3.9 g/dL (ref 3.5–5.0)
Alkaline Phosphatase: 73 U/L (ref 38–126)
Anion gap: 9 (ref 5–15)
BUN: 12 mg/dL (ref 6–20)
CO2: 27 mmol/L (ref 22–32)
Calcium: 8.7 mg/dL — ABNORMAL LOW (ref 8.9–10.3)
Chloride: 104 mmol/L (ref 98–111)
Creatinine, Ser: 0.88 mg/dL (ref 0.61–1.24)
GFR, Estimated: >60 mL/min (ref >60)
Glucose, Bld: 103 mg/dL — ABNORMAL HIGH (ref 70–99)
Potassium: 3.7 mmol/L (ref 3.5–5.1)
Sodium: 140 mmol/L (ref 135–145)
Total Bilirubin: 0.4 mg/dL (ref 0.3–1.2)
Total Protein: 6.8 g/dL (ref 6.5–8.1)

## 2023-06-16 LAB — LIPASE, BLOOD: Lipase: 379 U/L — ABNORMAL HIGH (ref 11–51)

## 2023-06-16 MED ORDER — ONDANSETRON 4 MG PO TBDP: 4.0000 mg | ORAL_TABLET | Freq: Three times a day (TID) | ORAL | 0 refills | Status: DC | PRN

## 2023-06-16 MED ORDER — OXYCODONE HCL 5 MG PO TABS: 5.0000 mg | ORAL_TABLET | Freq: Four times a day (QID) | ORAL | 0 refills | Status: DC | PRN

## 2023-06-16 MED ORDER — FAMOTIDINE 20 MG PO TABS
20.0000 mg | ORAL_TABLET | Freq: Once | ORAL | Status: AC
  Administered 2023-06-16: 20 mg via ORAL
  Filled 2023-06-16: qty 1

## 2023-06-16 MED ORDER — ALUM & MAG HYDROXIDE-SIMETH 200-200-20 MG/5ML PO SUSP
30.0000 mL | Freq: Once | ORAL | Status: AC
  Administered 2023-06-16: 30 mL via ORAL
  Filled 2023-06-16: qty 30

## 2023-06-16 MED ORDER — IOHEXOL 300 MG/ML  SOLN
100.0000 mL | Freq: Once | INTRAMUSCULAR | Status: AC | PRN
  Administered 2023-06-16: 80 mL via INTRAVENOUS

## 2023-06-16 MED ORDER — LIDOCAINE VISCOUS HCL 2 % MT SOLN
15.0000 mL | Freq: Once | OROMUCOSAL | Status: AC
  Administered 2023-06-16: 15 mL via OROMUCOSAL
  Filled 2023-06-16: qty 15

## 2023-06-16 MED ORDER — PANTOPRAZOLE SODIUM 20 MG PO TBEC: 20.0000 mg | DELAYED_RELEASE_TABLET | Freq: Every day | ORAL | 1 refills | Status: DC

## 2023-06-16 MED ORDER — ACETAMINOPHEN 325 MG PO TABS: 650.0000 mg | ORAL_TABLET | Freq: Four times a day (QID) | ORAL | 0 refills | Status: AC | PRN

## 2023-06-16 NOTE — ED Provider Notes (Signed)
[place] EMERGENCY DEPARTMENT AT [facility] Provider Note   CSN: [id number] Arrival date & time: 06/16/23  1429     History {Add pertinent medical, surgical, social history, OB history to HPI:1} Chief Complaint  Patient presents with  . Abdominal Pain    [name] [name] is a 57 y.o. male.   Abdominal Pain   57 year old male presents emergency department with complaints of abdominal pain.  Patient states that he has been having worsening abdominal pain over the past week or so.  States he has known stomach ulcer and feels like it is flaring up again.  Reports history of chronic alcohol use but states he has been without alcohol for for the past month or so.  States that he was seen in June of this year and diagnosed with large stomach ulcer.  States he was placed on Protonix but ran out of medication after a month supply and has not been on said medication.  Reports pain in his upper middle abdomen as well as the right upper abdomen.  Denies radiation of pain.  States that pain is worsened with certain movements as well as consumption of certain food/liquids.  Denies any fever, chills, nausea, vomiting, urinary symptoms, change in bowel habits.  States he has upcoming appointment with GI in the next month for EGD.  Past medical history significant for hypertension, OSA, gastric ulcer, pancreatic pseudocyst  Home Medications Prior to Admission medications   Medication Sig Start Date End Date Taking? Authorizing Provider  amLODipine (NORVASC) 5 MG tablet Take 1 tablet (5 mg total) by mouth daily. Needs office visit for more refills 07/06/17   Law, Waylan Boga, PA-C  HYDROcodone-acetaminophen (NORCO/VICODIN) 5-325 MG tablet Take 1-2 tablets by mouth every 6 (six) hours as needed for severe pain. 07/06/17   Law, Waylan Boga, PA-C  ibuprofen (ADVIL) 800 MG tablet Take 1 tablet (800 mg total) by mouth 3 (three) times daily with meals. 06/05/23   Mardella Layman, MD  ondansetron  (ZOFRAN-ODT) 4 MG disintegrating tablet Take 1 tablet (4 mg total) by mouth every 8 (eight) hours as needed for nausea or vomiting. 04/17/23   Renne Crigler, PA-C  pantoprazole (PROTONIX) 20 MG tablet Take 1 tablet (20 mg total) by mouth 2 (two) times daily. 04/17/23   Renne Crigler, PA-C      Allergies    Patient has no known allergies.    Review of Systems   Review of Systems  Gastrointestinal:  Positive for abdominal pain.  All other systems reviewed and are negative.   Physical Exam Updated Vital Signs BP (!) 144/99 (BP Location: Right Arm)   Pulse 92   Temp 98.2 F (36.8 C) (Oral)   Resp 16   Ht 6\' 1"  (1.854 m)   Wt 81.6 kg   SpO2 99%   BMI 23.75 kg/m  Physical Exam Vitals and nursing note reviewed.  Constitutional:      General: He is not in acute distress.    Appearance: He is well-developed.  HENT:     Head: Normocephalic and atraumatic.  Eyes:     Conjunctiva/sclera: Conjunctivae normal.  Cardiovascular:     Rate and Rhythm: Normal rate and regular rhythm.     Heart sounds: No murmur heard. Pulmonary:     Effort: Pulmonary effort is normal. No respiratory distress.     Breath sounds: Normal breath sounds.  Abdominal:     Palpations: Abdomen is soft.     Tenderness: There is abdominal tenderness in the  right upper quadrant and epigastric area. There is no guarding or rebound.  Musculoskeletal:        General: No swelling.     Cervical back: Neck supple.  Skin:    General: Skin is warm and dry.     Capillary Refill: Capillary refill takes less than 2 seconds.  Neurological:     Mental Status: He is alert.  Psychiatric:        Mood and Affect: Mood normal.     ED Results / Procedures / Treatments   Labs (all labs ordered are listed, but only abnormal results are displayed) Labs Reviewed  LIPASE, BLOOD - Abnormal; Notable for the following components:      Result Value   Lipase 379 (*)    All other components within normal limits  COMPREHENSIVE  METABOLIC PANEL - Abnormal; Notable for the following components:   Glucose, Bld 103 (*)    Calcium 8.7 (*)    AST 13 (*)    All other components within normal limits  CBC - Abnormal; Notable for the following components:   WBC 11.4 (*)    RDW 16.0 (*)    All other components within normal limits  URINALYSIS, ROUTINE W REFLEX MICROSCOPIC    EKG None  Radiology No results found.  Procedures Procedures  {Document cardiac monitor, telemetry assessment procedure when appropriate:1}  Medications Ordered in ED Medications  alum & mag hydroxide-simeth (MAALOX/MYLANTA) 200-200-20 MG/5ML suspension 30 mL (has no administration in time range)  lidocaine (XYLOCAINE) 2 % viscous mouth solution 15 mL (has no administration in time range)  famotidine (PEPCID) tablet 20 mg (has no administration in time range)    ED Course/ Medical Decision Making/ A&P Clinical Course as of 06/16/23 Reece Agar Jun 16, 2023  1845 Consulted Dr. Dulce Sellar of gastroenterology who recommended consulting [facility]. [CR]  1902 Consulted Dr. Adela Lank of GI who recommended admission to the hospital.  Will attempt to talk to patient regarding admission. [CR]    Clinical Course User Index [CR] Peter Garter, PA   {   Click here for ABCD2, HEART and other calculatorsREFRESH Note before signing :1}                              Medical Decision Making Amount and/or Complexity of Data Reviewed Labs: ordered. Radiology: ordered.  Risk OTC drugs. Prescription drug management.   This patient presents to the ED for concern of abdominal pain, this involves an extensive number of treatment options, and is a complaint that carries with it a high risk of complications and morbidity.  The differential diagnosis includes gastritis, PUD, pancreatitis, CBD pathology, cholecystitis, SBO/LBO, volvulus or diverticulitis, appendicitis   Co morbidities that complicate the patient evaluation  See HPI   Additional  history obtained:  Additional history obtained from EMR External records from outside source obtained and reviewed including hospital records   Lab Tests:  I Ordered, and personally interpreted labs.  The pertinent results include: Leukocytosis of 11.4.  No evidence of anemia.  Placed within range.  Lipase elevated at 379.  Mild hypocalcemia of 8.7 but otherwise, electrolytes within normal limits.  No transaminitis.  No renal dysfunction.  UA significant for 30 proteins but otherwise unremarkable   Imaging Studies ordered:  I ordered imaging studies including CT abdomen pelvis I independently visualized and interpreted imaging which showed acute pancreatitis with mild attrition/edema around pancreas.  Loculated collection in uncinate  process. I agree with the radiologist interpretation  Cardiac Monitoring: / EKG:  The patient was maintained on a cardiac monitor.  I personally viewed and interpreted the cardiac monitored which showed an underlying rhythm of: Sinus rhythm   Consultations Obtained:  See ED course  Problem List / ED Course / Critical interventions / Medication management  Pancreatitis/pseudocyst I ordered medication including lidocaine, Pepcid, Maalox   Reevaluation of the patient after these medicines showed that the patient improved I have reviewed the patients home medicines and have made adjustments as needed   Social Determinants of Health:  History of alcohol abuse.  Chronic cigarette use.   Test / Admission - Considered:  Pancreatitis/pseudocyst Vitals signs significant for hypertension blood pressure 151/107. Otherwise within normal range and stable throughout visit. Laboratory/imaging studies significant for: See above *** Worrisome signs and symptoms were discussed with the patient, and the patient acknowledged understanding to return to the ED if noticed. Patient was stable upon discharge.    {Document critical care time when  appropriate:1} {Document review of labs and clinical decision tools ie heart score, Chads2Vasc2 etc:1}  {Document your independent review of radiology images, and any outside records:1} {Document your discussion with family members, caretakers, and with consultants:1} {Document social determinants of health affecting pt's care:1} {Document your decision making why or why not admission, treatments were needed:1} Final Clinical Impression(s) / ED Diagnoses Final diagnoses:  None    Rx / DC Orders ED Discharge Orders     None

## 2023-06-16 NOTE — Discharge Instructions (Signed)
As discussed, symptoms most likely secondary to pancreatitis.  CT imaging also showed that your cyst was slightly larger.  Will recommend liquid diet until you follow-up with GI in the outpatient setting.  See information attached regarding liquid diet.  Will recommend Tylenol for baseline pain with pain medication for breakthrough pain.  Will also refill your Protonix to take daily as well as nausea medicine to take as needed.  Please avoid any alcohol as this will make your symptoms worse.  Your GI appointment is on September 6 but you may return to emergency department if you are unable to manage her symptoms at home.  Please do not hesitate to return to emergency department if there are worrisome signs and symptoms we discussed become apparent.

## 2023-06-16 NOTE — ED Triage Notes (Signed)
Pt via pov from home with abdominal pain in middle and right side of abdomen. Pt was seen previously for the same and was told he has an ulcer. Pt has appointment next month with GI, possibly for endoscopy. Pt denies emesis, endorses nausea. Pt alert  & oriented, nad noted.

## 2023-06-16 NOTE — ED Notes (Signed)
Reviewed AVS with patient, patient expressed understanding of directions, denies further questions at this time. 

## 2023-06-27 ENCOUNTER — Encounter: Payer: Self-pay | Admitting: Physician Assistant

## 2023-06-27 ENCOUNTER — Ambulatory Visit (INDEPENDENT_AMBULATORY_CARE_PROVIDER_SITE_OTHER): Payer: BC Managed Care – PPO | Admitting: Physician Assistant

## 2023-06-27 VITALS — BP 130/88 | HR 105 | Ht 73.0 in | Wt 176.0 lb

## 2023-06-27 DIAGNOSIS — R935 Abnormal findings on diagnostic imaging of other abdominal regions, including retroperitoneum: Secondary | ICD-10-CM

## 2023-06-27 DIAGNOSIS — R1013 Epigastric pain: Secondary | ICD-10-CM | POA: Diagnosis not present

## 2023-06-27 DIAGNOSIS — Z8719 Personal history of other diseases of the digestive system: Secondary | ICD-10-CM | POA: Diagnosis not present

## 2023-06-27 MED ORDER — AMBULATORY NON FORMULARY MEDICATION: 0 refills | Status: DC

## 2023-06-27 NOTE — Patient Instructions (Addendum)
_______________________________________________________  If your blood pressure at your visit was 140/90 or greater, please contact your primary care physician to follow up on this.  _______________________________________________________  If you are age 57 or older, your body mass index should be between 23-30. Your Body mass index is 23.22 kg/m. If this is out of the aforementioned range listed, please consider follow up with your Primary Care Provider.  If you are age 34 or younger, your body mass index should be between 19-25. Your Body mass index is 23.22 kg/m. If this is out of the aformentioned range listed, please consider follow up with your Primary Care Provider.   ________________________________________________________  The [facility] providers would like to encourage you to use [facility] to communicate with providers for non-urgent requests or questions.  Due to long hold times on the telephone, sending your provider a message by [facility] may be a faster and more efficient way to get a response.  Please allow 48 business hours for a response.  Please remember that this is for non-urgent requests.  _______________________________________________________  We have sent the following medications to your pharmacy for you to pick up at your convenience: Gi cocktail  [facility]'s information is below: Address: [address], [place], [place] [address]  [phone]  *Please DO NOT go directly from our office to pick up this medication! Give the pharmacy 1 day to process the prescription as this is compounded and takes time to make.  You have been scheduled for an endoscopy and colonoscopy. Please follow the written instructions given to you at your visit today.  Please pick up your prep supplies at the pharmacy within the next 1-3 days.  If you use inhalers (even only as needed), please bring them with you on the day of your procedure.  DO NOT TAKE 7 DAYS PRIOR  TO TEST- Trulicity (dulaglutide) Ozempic, Wegovy (semaglutide) Mounjaro (tirzepatide) Bydureon Bcise (exanatide extended release)  DO NOT TAKE 1 DAY PRIOR TO YOUR TEST Rybelsus (semaglutide) Adlyxin (lixisenatide) Victoza (liraglutide) Byetta (exanatide) ___________________________________________________________________________  It was a pleasure to see you today!  Thank you for trusting me with your gastrointestinal care!

## 2023-06-27 NOTE — Progress Notes (Signed)
Chief Complaint: Question ulcer, discuss EGD  HPI:    [name] is a 57 year old African-American male with a past medical history as listed below including hypertension, who presents to clinic today to discuss recent ER visit and question of ulcer.    04/17/2023 patient seen in the ER, noted history of pancreatitis and at that time with right upper quadrant abdominal pain for 3 weeks.  Pain started after drinking alcohol.  At that time lipase 66, WBC 10.8 and otherwise normal CMP and CBC.  CT at that time showed haziness/inflammation between the pancreas and posterior gastric wall, there appeared to be a large ulcer along the posterior gastric wall measuring up to 2.1 cm, it was difficult to determine if this was a large ulcer related to peptic ulcer disease or ulcerated gastric wall mass.  Also difficult to distinguish between inflammation of the stomach and pancreas related to large ulcer or pancreatitis.  Recommended endoscopy.  Patient was started on Pantoprazole 20 mg twice daily.  At that time noted patient had previously been dismissed from [facility].    06/16/2023 patient seen again in the ER for abdominal pain.  Worsening over the past week.  He ran out of Pantoprazole for a month.  Lipase at that time 379, CMP with a glucose of 103 and AST 13 and otherwise normal, WBC 11.4.  Repeat CT abdomen pelvis at that time with changes of acute pancreatitis with mild infiltration and edema around the pancreas improving since prior study, loculated collection in the end connate process was larger than on prior study and probably represented an early pseudocyst.  Other.  Pancreatic collection had decreased.  Otherwise no acute process.  At that time admission was recommended but patient did not want to be admitted.    Today, patient presents to clinic and tells me he was told to follow-up here after his CT in June showing a terrible gastric ulcer.  He explains that currently he is only having mild  epigastric pain compared to his usual.  Apparently has had multiple episodes of pancreatitis, so many he cannot remember.  Tells me when these episodes come on about once a month or so he gets nausea and feels "yucky", and then develops a severe pain which radiates from the front of his stomach to his back, sometimes if he just stops eating for a couple of days and then he can get over this, but other times he ends up in the ER as above as is evident in his chart.  Discontinued alcohol use after his last ER visit on 06/16/2023.  He has continue Pantoprazole 20 mg but is only taking this once a day.  He has never had a colonoscopy either.    Denies fever, chills, weight loss, nausea or vomiting currently.  Past Medical History:  Diagnosis Date   Hypertension    Sleep apnea     History reviewed. No pertinent surgical history.  Current Outpatient Medications  Medication Sig Dispense Refill   acetaminophen (TYLENOL) 325 MG tablet Take 2 tablets (650 mg total) by mouth every 6 (six) hours as needed. 30 tablet 0   amLODipine (NORVASC) 5 MG tablet Take 1 tablet (5 mg total) by mouth daily. Needs office visit for more refills 30 tablet 0   ondansetron (ZOFRAN-ODT) 4 MG disintegrating tablet Take 1 tablet (4 mg total) by mouth every 8 (eight) hours as needed for nausea or vomiting. 20 tablet 0   oxyCODONE (ROXICODONE) 5 MG immediate release  tablet Take 1 tablet (5 mg total) by mouth every 6 (six) hours as needed for severe pain. 9 tablet 0   pantoprazole (PROTONIX) 20 MG tablet Take 1 tablet (20 mg total) by mouth daily. 30 tablet 1   No current facility-administered medications for this visit.    Allergies as of 06/27/2023   (No Known Allergies)    Family History  Problem Relation Age of Onset   Stroke Mother    Hypertension Mother    Lung cancer Father    Liver disease Neg Hx    Esophageal cancer Neg Hx    Colon cancer Neg Hx     Social History   Socioeconomic History   Marital status:  Married    Spouse name: Not on file   Number of children: 1   Years of education: 14   Highest education level: Not on file  Occupational History   Occupation: Production   Occupation: reglatory specialit  Tobacco Use   Smoking status: Every Day    Current packs/day: 0.50    Types: Cigarettes   Smokeless tobacco: Never  Vaping Use   Vaping status: Never Used  Substance and Sexual Activity   Alcohol use: Not Currently    Comment: occasional   Drug use: No   Sexual activity: Not on file  Other Topics Concern   Not on file  Social History Narrative   Fun: Be off of work and be with family.   Social Determinants of Health   Financial Resource Strain: Not on file  Food Insecurity: Not on file  Transportation Needs: Not on file  Physical Activity: Not on file  Stress: Not on file  Social Connections: Not on file  Intimate Partner Violence: Not on file    Review of Systems:    Constitutional: No weight loss, fever or chills Cardiovascular: No chest pain Respiratory: No SOB Gastrointestinal: See HPI and otherwise negative Genitourinary: No dysuria Neurological: No headache, dizziness or syncope Musculoskeletal: No new muscle or joint pain Hematologic: No bleeding Psychiatric: No history of depression or anxiety   Physical Exam:  Vital signs: BP 130/88   Pulse (!) 105   Ht 6\' 1"  (1.854 m)   Wt 176 lb (79.8 kg)   BMI 23.22 kg/m    Constitutional:   Pleasant AA male appears to be in NAD, Well developed, Well nourished, alert and cooperative Head:  Normocephalic and atraumatic. Eyes:   PEERL, EOMI. No icterus. Conjunctiva pink. Ears:  Normal auditory acuity. Neck:  Supple Throat: Oral cavity and pharynx without inflammation, swelling or lesion.  Respiratory: Respirations even and unlabored. Lungs clear to auscultation bilaterally.   No wheezes, crackles, or rhonchi.  Cardiovascular: Normal S1, S2. No MRG. Regular rate and rhythm. No peripheral edema, cyanosis or  pallor.  Gastrointestinal:  Soft, nondistended, moderate epigastric ttp, No rebound or guarding. Normal bowel sounds. No appreciable masses or hepatomegaly. Rectal:  Not performed.  Msk:  Symmetrical without gross deformities. Without edema, no deformity or joint abnormality.  Neurologic:  Alert and  oriented x4;  grossly normal neurologically.  Skin:   Dry and intact without significant lesions or rashes. Psychiatric: Demonstrates good judgement and reason without abnormal affect or behaviors.  RELEVANT LABS AND IMAGING: CBC    Component Value Date/Time   WBC 11.4 (H) 06/16/2023 1445   RBC 4.82 06/16/2023 1445   HGB 13.7 06/16/2023 1445   HCT 42.3 06/16/2023 1445   PLT 370 06/16/2023 1445   MCV 87.8 06/16/2023 1445  MCH 28.4 06/16/2023 1445   MCHC 32.4 06/16/2023 1445   RDW 16.0 (H) 06/16/2023 1445   LYMPHSABS 1.5 10/21/2008 1837   MONOABS 0.9 10/21/2008 1837   EOSABS 0.0 10/21/2008 1837   BASOSABS 0.2 (H) 10/21/2008 1837    CMP     Component Value Date/Time   NA 140 06/16/2023 1445   K 3.7 06/16/2023 1445   CL 104 06/16/2023 1445   CO2 27 06/16/2023 1445   GLUCOSE 103 (H) 06/16/2023 1445   BUN 12 06/16/2023 1445   CREATININE 0.88 06/16/2023 1445   CALCIUM 8.7 (L) 06/16/2023 1445   PROT 6.8 06/16/2023 1445   ALBUMIN 3.9 06/16/2023 1445   AST 13 (L) 06/16/2023 1445   ALT 7 06/16/2023 1445   ALKPHOS 73 06/16/2023 1445   BILITOT 0.4 06/16/2023 1445   GFRNONAA >60 06/16/2023 1445   GFRAA  10/21/2008 1837    >60        The eGFR has been calculated using the MDRD equation. This calculation has not been validated in all clinical situations. eGFR's persistently <60 mL/min signify possible Chronic Kidney Disease.    Assessment: 1.  Abnormal CT of the abdomen: Initially in June showing large gastric ulcer, repeat a couple of weeks ago did not show this any further, has had recurrent pancreatitis and now pancreatic cyst 2.  Epigastric pain: Likely with all of the  above 3.  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  Screening for colorectal cancer: Patient has never had screening for colorectal cancer  Plan: 1.  Patient scheduled for diagnostic EGD and screening colonoscopy in the [facility] with Dr. [name].  Did provide the patient a detailed list of risks for the procedures and he agrees to proceed. Patient is appropriate for endoscopic procedure(s) in the ambulatory ([facility]) setting.  2.  I will have Dr. [name] review CT showing gastric ulcer and make sure there is no concern for this procedure to be done in the [facility]. 3.  Continue Pantoprazole 20 mg daily 4.  Discussed pancreatitis, if he does feel epigastric pain and symptoms coming on then he should back off to no food for at least a couple of days and then slowly increase his diet, first clear liquids then full liquids and low-fat.  He should permanently discontinue alcohol use as this is likely what is triggering his symptoms. 5.  Patient was prescribed a GI cocktail per his request 5-10 mL every 4-6 hours as needed for epigastric pain.  This was [facility]. 6.  Patient to follow in clinic per recommendations after time of procedures.  Hyacinth Meeker, PA-C [facility] 06/27/2023, 2:24 PM  Cc: No ref. provider found

## 2023-06-27 NOTE — Progress Notes (Signed)
I agree with the assessment and plan as outlined by Ms. Lemmon. Okay to proceed with EGD and colonoscopy in [facility] for further evaluation.

## 2023-07-11 ENCOUNTER — Encounter: Payer: Self-pay | Admitting: Internal Medicine

## 2023-07-15 ENCOUNTER — Telehealth: Payer: Self-pay | Admitting: Physician Assistant

## 2023-07-15 MED ORDER — AMBULATORY NON FORMULARY MEDICATION: 0 refills | Status: DC

## 2023-07-15 NOTE — Telephone Encounter (Signed)
Are you ok with refilling this GI cocktail?

## 2023-07-15 NOTE — Telephone Encounter (Signed)
Marland Kitchen  A user error has taken place: error

## 2023-07-15 NOTE — Telephone Encounter (Signed)
Inbound call from patient stating he dropped recent liquid prescription and it is all gone. Patient requesting for another prescription to be sent in as soon as possible due to being in severe pain. Please advise, thank you.

## 2023-07-15 NOTE — Telephone Encounter (Signed)
Medication sent and gave patient the phone number to call the pharmacy.

## 2023-07-23 ENCOUNTER — Encounter: Payer: Self-pay | Admitting: Certified Registered Nurse Anesthetist

## 2023-07-24 ENCOUNTER — Ambulatory Visit: Payer: BC Managed Care – PPO | Admitting: Internal Medicine

## 2023-07-24 ENCOUNTER — Encounter: Payer: Self-pay | Admitting: Internal Medicine

## 2023-07-24 VITALS — BP 136/103 | HR 108 | Temp 98.7°F | Resp 17 | Ht 73.0 in | Wt 176.0 lb

## 2023-07-24 DIAGNOSIS — Z8719 Personal history of other diseases of the digestive system: Secondary | ICD-10-CM

## 2023-07-24 DIAGNOSIS — D123 Benign neoplasm of transverse colon: Secondary | ICD-10-CM | POA: Diagnosis not present

## 2023-07-24 DIAGNOSIS — K2970 Gastritis, unspecified, without bleeding: Secondary | ICD-10-CM

## 2023-07-24 DIAGNOSIS — D122 Benign neoplasm of ascending colon: Secondary | ICD-10-CM

## 2023-07-24 DIAGNOSIS — Z1211 Encounter for screening for malignant neoplasm of colon: Secondary | ICD-10-CM | POA: Diagnosis present

## 2023-07-24 DIAGNOSIS — D120 Benign neoplasm of cecum: Secondary | ICD-10-CM | POA: Diagnosis not present

## 2023-07-24 DIAGNOSIS — R935 Abnormal findings on diagnostic imaging of other abdominal regions, including retroperitoneum: Secondary | ICD-10-CM

## 2023-07-24 DIAGNOSIS — R1013 Epigastric pain: Secondary | ICD-10-CM | POA: Diagnosis not present

## 2023-07-24 HISTORY — PX: UPPER GASTROINTESTINAL ENDOSCOPY: SHX188

## 2023-07-24 HISTORY — PX: COLONOSCOPY: SHX174

## 2023-07-24 MED ORDER — SODIUM CHLORIDE 0.9 % IV SOLN: 500.0000 mL | Freq: Once | INTRAVENOUS | Status: DC

## 2023-07-24 MED ORDER — PANTOPRAZOLE SODIUM 40 MG PO TBEC
40.0000 mg | DELAYED_RELEASE_TABLET | Freq: Two times a day (BID) | ORAL | 3 refills | Status: DC
Start: 2023-07-24 — End: 2024-09-15

## 2023-07-24 NOTE — Progress Notes (Signed)
1414 Robinul 0.1 mg IV given due large amount of secretions upon assessment.  MD made aware, vss 

## 2023-07-24 NOTE — Progress Notes (Signed)
GASTROENTEROLOGY PROCEDURE H&P NOTE   Primary Care Physician: Patient, No Pcp Per    Reason for Procedure:   Abnormal CT of the abdomen with large gastric ulcer, epigastric ab pain, colon cancer screening  Plan:    EGD/colonoscopy  Patient is appropriate for endoscopic procedure(s) in the ambulatory ([facility]) setting.  The nature of the procedure, as well as the risks, benefits, and alternatives were carefully and thoroughly reviewed with the patient. Ample time for discussion and questions allowed. The patient understood, was satisfied, and agreed to proceed.     HPI: [name] [name] is a 57 y.o. male who presents for EGD/colonoscopy for evaluation of abnormal CT of the abdomen with large gastric ulcer, epigastric ab pain, and colon cancer screening .  Patient was most recently seen in the Gastroenterology Clinic on 06/27/23.  No interval change in medical history since that appointment. Please refer to that note for full details regarding GI history and clinical presentation.   Past Medical History:  Diagnosis Date   Hypertension    Sleep apnea     History reviewed. No pertinent surgical history.  Prior to Admission medications   Medication Sig Start Date End Date Taking? Authorizing Provider  ondansetron (ZOFRAN-ODT) 4 MG disintegrating tablet Take 1 tablet (4 mg total) by mouth every 8 (eight) hours as needed for nausea or vomiting. 06/16/23  Yes Sherian Maroon A, PA  pantoprazole (PROTONIX) 20 MG tablet Take 1 tablet (20 mg total) by mouth daily. 06/16/23  Yes Sherian Maroon A, PA  acetaminophen (TYLENOL) 325 MG tablet Take 2 tablets (650 mg total) by mouth every 6 (six) hours as needed. 06/16/23   Peter Garter, PA  AMBULATORY NON FORMULARY MEDICATION Medication Name:  Gi Cocktail 90ml 2% viscous lidocaine, 90ml bentyl 10mg /5ml and maalox 400mg . Take 5-77ml every 4-6 hours as needed 07/15/23   Unk Lightning, PA  amLODipine (NORVASC) 5 MG tablet Take 1 tablet  (5 mg total) by mouth daily. Needs office visit for more refills 07/06/17   Law, Waylan Boga, PA-C  oxyCODONE (ROXICODONE) 5 MG immediate release tablet Take 1 tablet (5 mg total) by mouth every 6 (six) hours as needed for severe pain. 06/16/23   Peter Garter, PA    Current Outpatient Medications  Medication Sig Dispense Refill   ondansetron (ZOFRAN-ODT) 4 MG disintegrating tablet Take 1 tablet (4 mg total) by mouth every 8 (eight) hours as needed for nausea or vomiting. 20 tablet 0   pantoprazole (PROTONIX) 20 MG tablet Take 1 tablet (20 mg total) by mouth daily. 30 tablet 1   acetaminophen (TYLENOL) 325 MG tablet Take 2 tablets (650 mg total) by mouth every 6 (six) hours as needed. 30 tablet 0   AMBULATORY NON FORMULARY MEDICATION Medication Name:  Gi Cocktail 90ml 2% viscous lidocaine, 90ml bentyl 10mg /35ml and maalox 400mg . Take 5-24ml every 4-6 hours as needed 120 mL 0   amLODipine (NORVASC) 5 MG tablet Take 1 tablet (5 mg total) by mouth daily. Needs office visit for more refills 30 tablet 0   oxyCODONE (ROXICODONE) 5 MG immediate release tablet Take 1 tablet (5 mg total) by mouth every 6 (six) hours as needed for severe pain. 9 tablet 0   Current Facility-Administered Medications  Medication Dose Route Frequency Provider Last Rate Last Admin   0.9 %  sodium chloride infusion  500 mL Intravenous Once Imogene Burn, MD        Allergies as of 07/24/2023   (No  Known Allergies)    Family History  Problem Relation Age of Onset   Stroke Mother    Hypertension Mother    Lung cancer Father    Liver disease Neg Hx    Esophageal cancer Neg Hx    Colon cancer Neg Hx     Social History   Socioeconomic History   Marital status: Married    Spouse name: Not on file   Number of children: 1   Years of education: 14   Highest education level: Not on file  Occupational History   Occupation: Production   Occupation: reglatory specialit  Tobacco Use   Smoking status: Every Day     Current packs/day: 0.50    Types: Cigarettes   Smokeless tobacco: Never  Vaping Use   Vaping status: Never Used  Substance and Sexual Activity   Alcohol use: Not Currently    Comment: occasional   Drug use: No   Sexual activity: Not on file  Other Topics Concern   Not on file  Social History Narrative   Fun: Be off of work and be with family.   Social Determinants of Health   Financial Resource Strain: Not on file  Food Insecurity: Not on file  Transportation Needs: Not on file  Physical Activity: Not on file  Stress: Not on file  Social Connections: Not on file  Intimate Partner Violence: Not on file    Physical Exam: Vital signs in last 24 hours: BP 134/72   Pulse 98   Temp 98.7 F (37.1 C)   Ht 6\' 1"  (1.854 m)   Wt 176 lb (79.8 kg)   SpO2 98%   BMI 23.22 kg/m  GEN: NAD EYE: Sclerae anicteric ENT: MMM CV: Non-tachycardic Pulm: No increased WOB GI: Soft NEURO:  Alert & Oriented   Eulah Pont, MD [facility]   07/24/2023 2:09 PM

## 2023-07-24 NOTE — Op Note (Signed)
[facility] Patient Name: [name] [name] Procedure Date: 07/24/2023 2:12 PM MRN: [id number] Endoscopist: Particia Lather , , [id number] Age: 57 Referring MD:  Date of Birth: August 03, 1966 Gender: Male Account #: [id number] Procedure:                Colonoscopy Indications:              Screening for colorectal malignant neoplasm, This                            is the patient's first colonoscopy Medicines:                Monitored Anesthesia Care Procedure:                Pre-Anesthesia Assessment:                           - Prior to the procedure, a History and Physical                            was performed, and patient medications and                            allergies were reviewed. The patient's tolerance of                            previous anesthesia was also reviewed. The risks                            and benefits of the procedure and the sedation                            options and risks were discussed with the patient.                            All questions were answered, and informed consent                            was obtained. Prior Anticoagulants: The patient has                            taken no anticoagulant or antiplatelet agents. ASA                            Grade Assessment: II - A patient with mild systemic                            disease. After reviewing the risks and benefits,                            the patient was deemed in satisfactory condition to                            undergo the procedure.  After obtaining informed consent, the colonoscope                            was passed under direct vision. Throughout the                            procedure, the patient's blood pressure, pulse, and                            oxygen saturations were monitored continuously. The                            CF [id number] #[id number] was introduced through the anus                            and advanced to the  the terminal ileum. The                            colonoscopy was performed without difficulty. The                            patient tolerated the procedure well. The quality                            of the bowel preparation was adequate. The terminal                            ileum, ileocecal valve, appendiceal orifice, and                            rectum were photographed. Scope In: 2:33:15 PM Scope Out: 3:41:45 PM Scope Withdrawal Time: 1 hour 4 minutes 16 seconds  Total Procedure Duration: 1 hour 8 minutes 30 seconds  Findings:                 The terminal ileum appeared normal.                           A 3 to 10 mm polyp was found in the transverse                            colon ascending colon cecum. The polyp was sessile.                            These polyps were removed with a cold snare.                            Resection and retrieval were complete.                           A 16 mm polyp was found in the ascending colon. The                            polyp was pedunculated. The  polyp was removed with                            a hot snare. Resection and retrieval were complete.                           A 40 mm polyp was found in the transverse colon.                            The polyp was pedunculated. The polyp was removed                            piecemeal with a hot snare. Resection and retrieval                            were complete using a Roth net. Area distal to and                            on the opposite wall of the polypectomy site was                            tattooed with an injection of Spot (carbon black).                           A 12 mm polyp was found in the transverse colon.                            The polyp was pedunculated. The polyp was removed                            with a hot snare. Resection and retrieval were                            complete.                           Non-bleeding internal hemorrhoids were found  during                            retroflexion. Complications:            No immediate complications. Estimated Blood Loss:     Estimated blood loss was minimal. Impression:               - The examined portion of the ileum was normal.                           - One 3 to 10 mm polyp in the transverse colon in                            the ascending colon in the cecum, removed with a  cold snare. Resected and retrieved.                           - One 16 mm polyp in the ascending colon, removed                            with a hot snare. Resected and retrieved.                           - One 40 mm polyp in the transverse colon, removed                            with a hot snare. Resected and retrieved. Tattooed.                           - One 12 mm polyp in the transverse colon, removed                            with a hot snare. Resected and retrieved.                           - Non-bleeding internal hemorrhoids. Recommendation:           - Discharge patient to home (with escort).                           - Await pathology results.                           - Repeat colonoscopy in 6 months for surveillance                            after piecemeal polypectomy.                           - The findings and recommendations were discussed                            with the patient. Dr Particia Lather "[name]" [name],  07/24/2023 3:59:38 PM

## 2023-07-24 NOTE — Progress Notes (Signed)
1452 Nasopharyngeal airway size 7.0 placed without trauma, vss

## 2023-07-24 NOTE — Progress Notes (Signed)
1505 BP 172/118, Labetalol given IV, MD update, vss

## 2023-07-24 NOTE — Progress Notes (Signed)
1501 BP 161/114, Labetalol given IV, MD update, vss

## 2023-07-24 NOTE — Progress Notes (Signed)
1500 HR > 100 with esmolol 25 mg given IV, MD updated, vss  

## 2023-07-24 NOTE — Progress Notes (Signed)
1420 HR > 100 with esmolol 25 mg given IV, MD updated, vss

## 2023-07-24 NOTE — Progress Notes (Signed)
Called to room to assist during endoscopic procedure.  Patient ID and intended procedure confirmed with present staff. Received instructions for my participation in the procedure from the performing physician.  

## 2023-07-24 NOTE — Progress Notes (Signed)
1444 HR > 100 with esmolol 25 mg given IV, MD updated, vss

## 2023-07-24 NOTE — Progress Notes (Signed)
1528 BP 151/110, Labetalol given IV, MD update, vss

## 2023-07-24 NOTE — Progress Notes (Signed)
Pt's states no medical or surgical changes since previsit or office visit. 

## 2023-07-24 NOTE — Progress Notes (Signed)
Report given to PACU, vss 

## 2023-07-24 NOTE — Progress Notes (Signed)
1515 BP 176/124, Labetalol given IV, MD update, vss

## 2023-07-24 NOTE — Patient Instructions (Addendum)
Await pathology results.  Repeat colonoscopy in 6 months for surveillance.  Handout on polyps, hemorrhoids, and gastritis provided.  Return to GI clinic in 2-2months (appointment).  A prescription for Pantoprazole 40mg  has been sent to your pharmacy.    YOU HAD AN ENDOSCOPIC PROCEDURE TODAY AT THE [facility]:   Refer to the procedure report that was given to you for any specific questions about what was found during the examination.  If the procedure report does not answer your questions, please call your gastroenterologist to clarify.  If you requested that your care partner not be given the details of your procedure findings, then the procedure report has been included in a sealed envelope for you to review at your convenience later.  YOU SHOULD EXPECT: Some feelings of bloating in the abdomen. Passage of more gas than usual.  Walking can help get rid of the air that was put into your GI tract during the procedure and reduce the bloating. If you had a lower endoscopy (such as a colonoscopy or flexible sigmoidoscopy) you may notice spotting of blood in your stool or on the toilet paper. If you underwent a bowel prep for your procedure, you may not have a normal bowel movement for a few days.  Please Note:  You might notice some irritation and congestion in your nose or some drainage.  This is from the oxygen used during your procedure.  There is no need for concern and it should clear up in a day or so.  SYMPTOMS TO REPORT IMMEDIATELY:  Following lower endoscopy (colonoscopy or flexible sigmoidoscopy):  Excessive amounts of blood in the stool  Significant tenderness or worsening of abdominal pains  Swelling of the abdomen that is new, acute  Fever of 100F or higher  Following upper endoscopy (EGD)  Vomiting of blood or coffee ground material  New chest pain or pain under the shoulder blades  Painful or persistently difficult swallowing  New shortness of breath  Fever of  100F or higher  Black, tarry-looking stools  For urgent or emergent issues, a gastroenterologist can be reached at any hour by calling [phone]. Do not use MyChart messaging for urgent concerns.    DIET:  We do recommend a small meal at first, but then you may proceed to your regular diet.  Drink plenty of fluids but you should avoid alcoholic beverages for 24 hours.  ACTIVITY:  You should plan to take it easy for the rest of today and you should NOT DRIVE or use heavy machinery until tomorrow (because of the sedation medicines used during the test).    FOLLOW UP: Our staff will call the number listed on your records the next business day following your procedure.  We will call around 7:15- 8:00 am to check on you and address any questions or concerns that you may have regarding the information given to you following your procedure. If we do not reach you, we will leave a message.     If any biopsies were taken you will be contacted by phone or by letter within the next 1-3 weeks.  Please call us at [phone] if you have not heard about the biopsies in 3 weeks.    SIGNATURES/CONFIDENTIALITY: You and/or your care partner have signed paperwork which will be entered into your electronic medical record.  These signatures attest to the fact that that the information above on your After Visit Summary has been reviewed and is understood.  Full responsibility of the  confidentiality of this discharge information lies with you and/or your care-partner.

## 2023-07-24 NOTE — Op Note (Signed)
[facility] Patient Name: [name] [name] Procedure Date: 07/24/2023 2:13 PM MRN: [id number] Endoscopist: Particia Lather , , [id number] Age: 57 Referring MD:  Date of Birth: 1966-09-05 Gender: Male Account #: [id number] Procedure:                Upper GI endoscopy Indications:              Epigastric abdominal pain, Abnormal CT of the GI                            tract Medicines:                Monitored Anesthesia Care Procedure:                Pre-Anesthesia Assessment:                           - Prior to the procedure, a History and Physical                            was performed, and patient medications and                            allergies were reviewed. The patient's tolerance of                            previous anesthesia was also reviewed. The risks                            and benefits of the procedure and the sedation                            options and risks were discussed with the patient.                            All questions were answered, and informed consent                            was obtained. Prior Anticoagulants: The patient has                            taken no anticoagulant or antiplatelet agents. ASA                            Grade Assessment: II - A patient with mild systemic                            disease. After reviewing the risks and benefits,                            the patient was deemed in satisfactory condition to                            undergo the procedure.  After obtaining informed consent, the endoscope was                            passed under direct vision. Throughout the                            procedure, the patient's blood pressure, pulse, and                            oxygen saturations were monitored continuously. The                            GIF [id number] #[id number] was introduced through the                            mouth, and advanced to the second part of  duodenum.                            The upper GI endoscopy was accomplished without                            difficulty. The patient tolerated the procedure                            well. Scope In: Scope Out: Findings:                 The examined esophagus was normal.                           Localized inflammation characterized by congestion                            (edema), erosions and erythema was found in the                            gastric fundus and in the gastric antrum. Biopsies                            were taken with a cold forceps for histology.                           Localized nodular mucosa was found in the duodenal                            bulb and in the second portion of the duodenum.                            Biopsies were taken with a cold forceps for                            histology. Complications:            No immediate complications. Estimated Blood Loss:     Estimated blood loss was minimal. Impression:               -  Normal esophagus.                           - Gastritis. Biopsied.                           - Nodular mucosa in the duodenal bulb and in the                            second portion of the duodenum. Biopsied. Recommendation:           - Await pathology results.                           - Use Protonix (pantoprazole) 40 mg PO BID for 8                            weeks, then decrease to QD.                           - Continue to avoid alcohol use.                           - Follow up in GI clinic in 2-3 months.                           - Perform a colonoscopy today. Dr Particia Lather "[name] [name]" [name] [name],  07/24/2023 3:52:37 PM

## 2023-07-25 ENCOUNTER — Telehealth: Payer: Self-pay

## 2023-07-25 NOTE — Telephone Encounter (Signed)
Post procedure follow up call, no answer 

## 2023-07-29 ENCOUNTER — Encounter: Payer: Self-pay | Admitting: Internal Medicine

## 2023-07-29 LAB — SURGICAL PATHOLOGY

## 2023-07-29 NOTE — Progress Notes (Signed)
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 Recommend bismuth quadruple therapy for treatment: - Tetracycline 500 mg QID x 14 days - Flagyl 250 mg QID x 14 days - Bismuth subsalicylate 524 mg QID x 14 days - PPI BID x 14 days  Let's arrange for a GI clinic follow up in 6-8 weeks to test for eradication of his infection. Thanks.

## 2023-07-30 ENCOUNTER — Telehealth: Payer: Self-pay | Admitting: Internal Medicine

## 2023-07-30 ENCOUNTER — Other Ambulatory Visit: Payer: Self-pay

## 2023-07-30 MED ORDER — METRONIDAZOLE 250 MG PO TABS: 250.0000 mg | ORAL_TABLET | Freq: Four times a day (QID) | ORAL | 0 refills | Status: AC

## 2023-07-30 MED ORDER — TETRACYCLINE HCL 500 MG PO CAPS
500.0000 mg | ORAL_CAPSULE | Freq: Four times a day (QID) | ORAL | 0 refills | Status: AC
Start: 2023-07-30 — End: 2023-08-13

## 2023-07-30 NOTE — Telephone Encounter (Signed)
Patient is returning your call.  

## 2023-07-30 NOTE — Telephone Encounter (Signed)
See pathology report of 07/24/23

## 2023-08-22 ENCOUNTER — Telehealth: Payer: Self-pay | Admitting: Internal Medicine

## 2023-08-22 NOTE — Telephone Encounter (Signed)
Inbound call from patient, would like GI cocktail refilled, patient also states he was prescribed pain medication and would like that refilled as well.

## 2023-08-22 NOTE — Telephone Encounter (Signed)
Inbound call from patient, following up on call below.

## 2023-08-23 ENCOUNTER — Encounter (HOSPITAL_BASED_OUTPATIENT_CLINIC_OR_DEPARTMENT_OTHER): Payer: Self-pay | Admitting: Emergency Medicine

## 2023-08-23 ENCOUNTER — Emergency Department (HOSPITAL_BASED_OUTPATIENT_CLINIC_OR_DEPARTMENT_OTHER)
Admission: EM | Admit: 2023-08-23 | Discharge: 2023-08-23 | Disposition: A | Discharge status: Home / Self Care | Payer: BC Managed Care – PPO | Attending: Emergency Medicine | Admitting: Emergency Medicine

## 2023-08-23 DIAGNOSIS — D72829 Elevated white blood cell count, unspecified: Secondary | ICD-10-CM | POA: Insufficient documentation

## 2023-08-23 DIAGNOSIS — I10 Essential (primary) hypertension: Secondary | ICD-10-CM | POA: Diagnosis not present

## 2023-08-23 DIAGNOSIS — Z79899 Other long term (current) drug therapy: Secondary | ICD-10-CM | POA: Insufficient documentation

## 2023-08-23 DIAGNOSIS — R1013 Epigastric pain: Secondary | ICD-10-CM | POA: Diagnosis present

## 2023-08-23 DIAGNOSIS — K859 Acute pancreatitis without necrosis or infection, unspecified: Secondary | ICD-10-CM | POA: Diagnosis not present

## 2023-08-23 LAB — CBC WITH DIFFERENTIAL/PLATELET
Abs Immature Granulocytes: 0.04 10*3/uL (ref 0.00–0.07)
Basophils Absolute: 0.1 10*3/uL (ref 0.0–0.1)
Basophils Relative: 0 %
Eosinophils Absolute: 0 10*3/uL (ref 0.0–0.5)
Eosinophils Relative: 0 %
HCT: 42 % (ref 39.0–52.0)
Hemoglobin: 13.6 g/dL (ref 13.0–17.0)
Immature Granulocytes: 0 %
Lymphocytes Relative: 6 %
Lymphs Abs: 0.9 10*3/uL (ref 0.7–4.0)
MCH: 28 pg (ref 26.0–34.0)
MCHC: 32.4 g/dL (ref 30.0–36.0)
MCV: 86.4 fL (ref 80.0–100.0)
Monocytes Absolute: 0.8 10*3/uL (ref 0.1–1.0)
Monocytes Relative: 6 %
Neutro Abs: 12.3 10*3/uL — ABNORMAL HIGH (ref 1.7–7.7)
Neutrophils Relative %: 88 %
Platelets: 339 10*3/uL (ref 150–400)
RBC: 4.86 MIL/uL (ref 4.22–5.81)
RDW: 17.2 % — ABNORMAL HIGH (ref 11.5–15.5)
WBC: 14.1 10*3/uL — ABNORMAL HIGH (ref 4.0–10.5)
nRBC: 0 % (ref 0.0–0.2)

## 2023-08-23 LAB — COMPREHENSIVE METABOLIC PANEL
ALT: 5 U/L (ref 0–44)
AST: 12 U/L — ABNORMAL LOW (ref 15–41)
Albumin: 4.1 g/dL (ref 3.5–5.0)
Alkaline Phosphatase: 68 U/L (ref 38–126)
Anion gap: 8 (ref 5–15)
BUN: 7 mg/dL (ref 6–20)
CO2: 28 mmol/L (ref 22–32)
Calcium: 9.5 mg/dL (ref 8.9–10.3)
Chloride: 100 mmol/L (ref 98–111)
Creatinine, Ser: 0.88 mg/dL (ref 0.61–1.24)
GFR, Estimated: >60 mL/min (ref >60)
Glucose, Bld: 120 mg/dL — ABNORMAL HIGH (ref 70–99)
Potassium: 4.1 mmol/L (ref 3.5–5.1)
Sodium: 136 mmol/L (ref 135–145)
Total Bilirubin: 0.5 mg/dL (ref 0.3–1.2)
Total Protein: 6.7 g/dL (ref 6.5–8.1)

## 2023-08-23 LAB — LIPASE, BLOOD: Lipase: 3432 U/L — ABNORMAL HIGH (ref 11–51)

## 2023-08-23 MED ORDER — SODIUM CHLORIDE 0.9 % IV BOLUS
500.0000 mL | Freq: Once | INTRAVENOUS | Status: AC
  Administered 2023-08-23: 500 mL via INTRAVENOUS

## 2023-08-23 MED ORDER — OXYCODONE HCL 5 MG PO TABS: 5.0000 mg | ORAL_TABLET | Freq: Four times a day (QID) | ORAL | 0 refills | Status: DC | PRN

## 2023-08-23 MED ORDER — HYDROMORPHONE HCL 1 MG/ML IJ SOLN
0.5000 mg | Freq: Once | INTRAMUSCULAR | Status: AC
  Administered 2023-08-23: 0.5 mg via INTRAVENOUS
  Filled 2023-08-23: qty 1

## 2023-08-23 MED ORDER — ONDANSETRON 4 MG PO TBDP: 4.0000 mg | ORAL_TABLET | Freq: Three times a day (TID) | ORAL | 0 refills | Status: DC | PRN

## 2023-08-23 MED ORDER — ONDANSETRON HCL 4 MG/2ML IJ SOLN
4.0000 mg | Freq: Once | INTRAMUSCULAR | Status: AC
  Administered 2023-08-23: 4 mg via INTRAVENOUS
  Filled 2023-08-23: qty 2

## 2023-08-23 NOTE — ED Triage Notes (Signed)
Abdo pains started 2 days ago. Denies n/v/d Recently given abt and ppi by GI.

## 2023-08-23 NOTE — ED Provider Notes (Signed)
[facility] AT [facility] Provider Note   CSN: [id number] Arrival date & time: 08/23/23  1656     History  Chief Complaint  Patient presents with   Abdominal Pain    [name] [name] is a 57 y.o. male.   Abdominal Pain Patient presents with epigastric pain to the left upper abdomen.  Started around 2 days ago.  Has had previous pancreatitis.  States he was drinking prior to this starting.  Has had recent upper endoscopy and lower endoscopy by [facility].  Found to have H. pylori.  This was down about a month ago but just started antibiotics 2 days ago.  No diarrhea.  States he is out of his pain medicine.    Past Medical History:  Diagnosis Date   Hypertension    Sleep apnea     Home Medications Prior to Admission medications   Medication Sig Start Date End Date Taking? Authorizing Provider  acetaminophen (TYLENOL) 325 MG tablet Take 2 tablets (650 mg total) by mouth every 6 (six) hours as needed. 06/16/23   Peter Garter, PA  AMBULATORY NON FORMULARY MEDICATION Medication Name:  Gi Cocktail 90ml 2% viscous lidocaine, 90ml bentyl 10mg /48ml and maalox 400mg . Take 5-67ml every 4-6 hours as needed 07/15/23   Unk Lightning, PA  amLODipine (NORVASC) 5 MG tablet Take 1 tablet (5 mg total) by mouth daily. Needs office visit for more refills 07/06/17   Law, Waylan Boga, PA-C  ondansetron (ZOFRAN-ODT) 4 MG disintegrating tablet Take 1 tablet (4 mg total) by mouth every 8 (eight) hours as needed for nausea or vomiting. 08/23/23   Benjiman Core, MD  oxyCODONE (ROXICODONE) 5 MG immediate release tablet Take 1 tablet (5 mg total) by mouth every 6 (six) hours as needed for severe pain (pain score 7-10). 08/23/23   Benjiman Core, MD  pantoprazole (PROTONIX) 20 MG tablet Take 1 tablet (20 mg total) by mouth daily. 06/16/23   Peter Garter, PA  pantoprazole (PROTONIX) 40 MG tablet Take 1 tablet (40 mg total) by mouth 2 (two) times daily. Take  40mg  BID for 8 weeks, then decrease to daily 07/24/23   Imogene Burn, MD      Allergies    Patient has no known allergies.    Review of Systems   Review of Systems  Gastrointestinal:  Positive for abdominal pain.    Physical Exam Updated Vital Signs BP (!) 169/103   Pulse 77   Temp 97.8 F (36.6 C) (Oral)   Resp 16   SpO2 97%  Physical Exam Vitals and nursing note reviewed.  Cardiovascular:     Rate and Rhythm: Normal rate.  Pulmonary:     Effort: Pulmonary effort is normal.  Abdominal:     Tenderness: There is abdominal tenderness.     Comments: Epigastric to left upper quadrant tenderness.  Moderate.  Skin:    General: Skin is warm.  Neurological:     Mental Status: He is alert.     ED Results / Procedures / Treatments   Labs (all labs ordered are listed, but only abnormal results are displayed) Labs Reviewed  COMPREHENSIVE METABOLIC PANEL - Abnormal; Notable for the following components:      Result Value   Glucose, Bld 120 (*)    AST 12 (*)    All other components within normal limits  LIPASE, BLOOD - Abnormal; Notable for the following components:   Lipase 3,432 (*)    All other components  within normal limits  CBC WITH DIFFERENTIAL/PLATELET - Abnormal; Notable for the following components:   WBC 14.1 (*)    RDW 17.2 (*)    Neutro Abs 12.3 (*)    All other components within normal limits    EKG None  Radiology No results found.  Procedures Procedures    Medications Ordered in ED Medications  sodium chloride 0.9 % bolus 500 mL (0 mLs Intravenous Stopped 08/23/23 1838)  ondansetron (ZOFRAN) injection 4 mg (4 mg Intravenous Given 08/23/23 1751)  HYDROmorphone (DILAUDID) injection 0.5 mg (0.5 mg Intravenous Given 08/23/23 1751)    ED Course/ Medical Decision Making/ A&P                                 Medical Decision Making Amount and/or Complexity of Data Reviewed Labs: ordered.  Risk Prescription drug management.   Patient with  epigastric pain.  Also left upper quadrant pain.  Has had previous possible ulcer but on scope had just had some gastritis.  However does just starting antibiotics for H. pylori.  However had been drinking alcohol.  Has had previous pancreatitis including pseudocyst.  Differential diagnosis does include ulcers and pancreatitis.  Lipase is severely elevated.  However patient feels better after treatment.  White count is elevated.  Patient has refused CT scan.  Has tolerated orals.  Feeling better.  Discussed with Dr. Elnoria Howard covering for gastroenterology.  We will stop the antibiotics for now.  He is only 2 or 3 days in of the 2-week course.  Will give pain medicine and antiemetics.  Short-term follow-up with GI.  Instructed not to drink alcohol.  Will return for worsening symptoms.  Causes such as perforated ulcers considered but felt less likely.  Doubt severe infection.        Final Clinical Impression(s) / ED Diagnoses Final diagnoses:  Acute pancreatitis, unspecified complication status, unspecified pancreatitis type    Rx / DC Orders ED Discharge Orders          Ordered    oxyCODONE (ROXICODONE) 5 MG immediate release tablet  Every 6 hours PRN        08/23/23 1904    ondansetron (ZOFRAN-ODT) 4 MG disintegrating tablet  Every 8 hours PRN        08/23/23 1904              Benjiman Core, MD 08/23/23 1913

## 2023-08-23 NOTE — ED Notes (Signed)
His wife is at his bedside.

## 2023-08-23 NOTE — ED Notes (Signed)
Reviewed AVS with patient, patient expressed understanding of directions, denies further questions at this time. 

## 2023-08-23 NOTE — Discharge Instructions (Addendum)
Stop the next that you are taking for the H. pylori.  Try and keep yourself hydrated.  Gradually work your way up from liquids to more food.

## 2023-08-24 ENCOUNTER — Telehealth (HOSPITAL_BASED_OUTPATIENT_CLINIC_OR_DEPARTMENT_OTHER): Payer: Self-pay | Admitting: Emergency Medicine

## 2023-08-24 MED ORDER — OXYCODONE HCL 5 MG PO TABS: 5.0000 mg | ORAL_TABLET | Freq: Four times a day (QID) | ORAL | 0 refills | Status: DC | PRN

## 2023-08-24 MED ORDER — OXYCODONE HCL 5 MG PO TABS: 5.0000 mg | ORAL_TABLET | Freq: Four times a day (QID) | ORAL | 0 refills | Status: AC | PRN

## 2023-08-24 MED ORDER — ONDANSETRON 4 MG PO TBDP: 4.0000 mg | ORAL_TABLET | Freq: Three times a day (TID) | ORAL | 0 refills | Status: AC | PRN

## 2023-08-24 NOTE — Telephone Encounter (Signed)
ER staff received a call noting that the patient prescription was out at his pharmacy, requested that the prescription be sent to [facility] [place].  On chart review, the patient was found to have acute pancreatitis, was discharged on oxycodone and Zofran.  Prescriptions updated.

## 2023-08-24 NOTE — Telephone Encounter (Signed)
The patient called the ER and informed staff that his oxycodone prescription was not available at his pharmacy, requested his pharmacy be updated and new prescription sent.

## 2023-08-27 ENCOUNTER — Other Ambulatory Visit: Payer: Self-pay

## 2023-08-27 MED ORDER — AMBULATORY NON FORMULARY MEDICATION: 0 refills | Status: AC

## 2023-10-31 ENCOUNTER — Ambulatory Visit: Payer: BC Managed Care – PPO | Admitting: Internal Medicine

## 2024-09-15 ENCOUNTER — Other Ambulatory Visit: Payer: Self-pay | Admitting: Internal Medicine

## 2024-09-15 DIAGNOSIS — K2970 Gastritis, unspecified, without bleeding: Secondary | ICD-10-CM

## 2024-09-15 DIAGNOSIS — R1013 Epigastric pain: Secondary | ICD-10-CM

## 2024-09-15 NOTE — Telephone Encounter (Signed)
 Called and left patient a detailed vm letting him know that we refilled his Pantoprazole  but he is due for an office visit within the next 3 months. Requested that patient call us  back to schedule.

## 2024-09-20 NOTE — Telephone Encounter (Signed)
 2nd attempt to reach patient. Lm on vm for patient to return call to schedule follow up appt.
